# Patient Record
Sex: Male | Born: 1965 | Race: White | Hispanic: No | Marital: Single | Smoking: Former smoker
Health system: Southern US, Community
[De-identification: ages and names within clinical notes are randomized; demographics above are authoritative.]

## PROBLEM LIST (undated history)

## (undated) DIAGNOSIS — Z87442 Personal history of urinary calculi: Secondary | ICD-10-CM

## (undated) DIAGNOSIS — Z8249 Family history of ischemic heart disease and other diseases of the circulatory system: Secondary | ICD-10-CM

## (undated) DIAGNOSIS — I10 Essential (primary) hypertension: Secondary | ICD-10-CM

## (undated) DIAGNOSIS — F419 Anxiety disorder, unspecified: Secondary | ICD-10-CM

## (undated) DIAGNOSIS — R519 Headache, unspecified: Secondary | ICD-10-CM

## (undated) DIAGNOSIS — H9319 Tinnitus, unspecified ear: Secondary | ICD-10-CM

## (undated) DIAGNOSIS — R51 Headache: Secondary | ICD-10-CM

## (undated) HISTORY — PX: ADENOIDECTOMY: SUR15

## (undated) HISTORY — PX: NASAL SINUS SURGERY: SHX719

## (undated) HISTORY — DX: Tinnitus, unspecified ear: H93.19

## (undated) HISTORY — DX: Gilbert syndrome: E80.4

## (undated) HISTORY — DX: Family history of ischemic heart disease and other diseases of the circulatory system: Z82.49

---
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
## 1997-08-17 ENCOUNTER — Ambulatory Visit: Admission: RE | Admit: 1997-08-17 | Discharge: 1997-08-17 | Payer: Self-pay | Admitting: Family Medicine

## 1999-11-28 ENCOUNTER — Encounter: Admission: RE | Admit: 1999-11-28 | Discharge: 1999-11-28 | Payer: Self-pay | Admitting: Orthopedic Surgery

## 1999-11-28 ENCOUNTER — Encounter: Payer: Self-pay | Admitting: Orthopedic Surgery

## 2003-10-27 ENCOUNTER — Emergency Department (HOSPITAL_COMMUNITY): Admission: EM | Admit: 2003-10-27 | Discharge: 2003-10-28 | Payer: Self-pay | Admitting: *Deleted

## 2004-09-28 HISTORY — PX: CARDIOVASCULAR STRESS TEST: SHX262

## 2005-09-30 HISTORY — PX: TRANSTHORACIC ECHOCARDIOGRAM: SHX275

## 2010-06-11 ENCOUNTER — Emergency Department (HOSPITAL_COMMUNITY)
Admission: EM | Admit: 2010-06-11 | Discharge: 2010-06-11 | Disposition: A | Discharge status: Home / Self Care | Payer: BC Managed Care – PPO | Attending: Emergency Medicine | Admitting: Emergency Medicine

## 2010-06-11 DIAGNOSIS — H1130 Conjunctival hemorrhage, unspecified eye: Secondary | ICD-10-CM | POA: Insufficient documentation

## 2010-06-11 DIAGNOSIS — I10 Essential (primary) hypertension: Secondary | ICD-10-CM | POA: Insufficient documentation

## 2011-01-09 ENCOUNTER — Other Ambulatory Visit: Payer: Self-pay

## 2011-01-09 ENCOUNTER — Observation Stay (HOSPITAL_COMMUNITY)
Admission: EM | Admit: 2011-01-09 | Discharge: 2011-01-10 | Disposition: A | Discharge status: Home / Self Care | Payer: BC Managed Care – PPO | Attending: Emergency Medicine | Admitting: Emergency Medicine

## 2011-01-09 ENCOUNTER — Encounter: Payer: Self-pay | Admitting: *Deleted

## 2011-01-09 DIAGNOSIS — F411 Generalized anxiety disorder: Secondary | ICD-10-CM | POA: Insufficient documentation

## 2011-01-09 DIAGNOSIS — I10 Essential (primary) hypertension: Secondary | ICD-10-CM | POA: Insufficient documentation

## 2011-01-09 DIAGNOSIS — R110 Nausea: Secondary | ICD-10-CM | POA: Insufficient documentation

## 2011-01-09 DIAGNOSIS — R079 Chest pain, unspecified: Principal | ICD-10-CM | POA: Insufficient documentation

## 2011-01-09 HISTORY — DX: Essential (primary) hypertension: I10

## 2011-01-09 HISTORY — DX: Anxiety disorder, unspecified: F41.9

## 2011-01-09 LAB — DIFFERENTIAL
Basophils Relative: 1 % (ref 0–1)
Eosinophils Absolute: 0.1 10*3/uL (ref 0.0–0.7)
Eosinophils Relative: 1 % (ref 0–5)
Lymphs Abs: 2.8 10*3/uL (ref 0.7–4.0)
Monocytes Relative: 8 % (ref 3–12)
Neutrophils Relative %: 58 % (ref 43–77)

## 2011-01-09 LAB — CBC
MCH: 29.7 pg (ref 26.0–34.0)
MCHC: 35.1 g/dL (ref 30.0–36.0)
MCV: 84.8 fL (ref 78.0–100.0)
Platelets: 275 10*3/uL (ref 150–400)
RBC: 5.25 MIL/uL (ref 4.22–5.81)

## 2011-01-09 LAB — COMPREHENSIVE METABOLIC PANEL
Albumin: 4.1 g/dL (ref 3.5–5.2)
Alkaline Phosphatase: 91 U/L (ref 39–117)
BUN: 18 mg/dL (ref 6–23)
Calcium: 9.1 mg/dL (ref 8.4–10.5)
GFR calc Af Amer: 89 mL/min — ABNORMAL LOW (ref >90)
Glucose, Bld: 102 mg/dL — ABNORMAL HIGH (ref 70–99)
Potassium: 3.2 mEq/L — ABNORMAL LOW (ref 3.5–5.1)
Sodium: 141 mEq/L (ref 135–145)
Total Protein: 7.2 g/dL (ref 6.0–8.3)

## 2011-01-09 LAB — TROPONIN I: Troponin I: <0.3 ng/mL (ref <0.30)

## 2011-01-09 LAB — CK TOTAL AND CKMB (NOT AT ARMC)
Relative Index: 1.6 (ref 0.0–2.5)
Total CK: 149 U/L (ref 7–232)

## 2011-01-09 NOTE — ED Notes (Signed)
Since Friday he has had some lt jaw pain and lt arm pain with some  Lt upper chest pain.  He has a history of some anxiety issues and he has had  Med changes

## 2011-01-10 ENCOUNTER — Encounter (HOSPITAL_COMMUNITY): Payer: Self-pay | Admitting: Emergency Medicine

## 2011-01-10 ENCOUNTER — Emergency Department (HOSPITAL_COMMUNITY): Payer: BC Managed Care – PPO

## 2011-01-10 LAB — CARDIAC PANEL(CRET KIN+CKTOT+MB+TROPI): Total CK: 138 U/L (ref 7–232)

## 2011-01-10 MED ORDER — METOPROLOL TARTRATE 25 MG PO TABS
50.0000 mg | ORAL_TABLET | Freq: Once | ORAL | Status: DC
  Filled 2011-01-10: qty 4

## 2011-01-10 MED ORDER — LORAZEPAM 2 MG/ML IJ SOLN
1.0000 mg | Freq: Once | INTRAMUSCULAR | Status: AC
  Administered 2011-01-10: 1 mg via INTRAVENOUS
  Filled 2011-01-10: qty 1

## 2011-01-10 MED ORDER — ALPRAZOLAM 0.5 MG PO TABS
0.5000 mg | ORAL_TABLET | Freq: Once | ORAL | Status: AC
  Administered 2011-01-10: 0.5 mg via ORAL

## 2011-01-10 MED ORDER — METOPROLOL TARTRATE 1 MG/ML IV SOLN
5.0000 mg | Freq: Once | INTRAVENOUS | Status: AC
  Administered 2011-01-10: 5 mg via INTRAVENOUS

## 2011-01-10 MED ORDER — LORAZEPAM 1 MG PO TABS: 1.0000 mg | ORAL_TABLET | Freq: Once | ORAL | Status: DC

## 2011-01-10 MED ORDER — IOHEXOL 350 MG/ML SOLN
80.0000 mL | Freq: Once | INTRAVENOUS | Status: AC | PRN
  Administered 2011-01-10: 80 mL via INTRAVENOUS

## 2011-01-10 MED ORDER — SODIUM CHLORIDE 0.9 % IV SOLN
Freq: Once | INTRAVENOUS | Status: AC
  Administered 2011-01-10: 10:00:00 via INTRAVENOUS

## 2011-01-10 MED ORDER — METOPROLOL TARTRATE 25 MG PO TABS
100.0000 mg | ORAL_TABLET | Freq: Once | ORAL | Status: AC
  Administered 2011-01-10: 100 mg via ORAL

## 2011-01-10 MED ORDER — NITROGLYCERIN 0.4 MG SL SUBL
SUBLINGUAL_TABLET | SUBLINGUAL | Status: AC
  Administered 2011-01-10: 0.4 mg
  Filled 2011-01-10: qty 25

## 2011-01-10 MED ORDER — METOPROLOL TARTRATE 1 MG/ML IV SOLN
INTRAVENOUS | Status: AC
  Administered 2011-01-10: 5 mg via INTRAVENOUS
  Filled 2011-01-10: qty 10

## 2011-01-10 NOTE — ED Notes (Signed)
Began having chest pain 1730 01/09/11. Pain in left jaw and arm on Sunday which he describes as pressure. Had stress test 8 years ago. Placed on beta blockers app 3 weeks ago.  States he hasn't been feeling well since starting beta blockers. States he still has some mild chest pressure.

## 2011-01-10 NOTE — ED Notes (Signed)
2nd administration of Metoprolol 5mg  given IV as per Dr Carlota Raspberry

## 2011-01-10 NOTE — ED Notes (Signed)
Returned from CT-A. Pt tolerated well. 

## 2011-01-10 NOTE — ED Provider Notes (Signed)
History     CSN: [id number] Arrival date & time: 01/09/2011  8:26 PM   First MD Initiated Contact with Patient 01/10/11 0029      Chief Complaint  Patient presents with  . Chest Pain    (Consider location/radiation/quality/duration/timing/severity/associated sxs/prior treatment) HPI  Past Medical History  Diagnosis Date  . Hypertension   . Anxiety     History reviewed. No pertinent past surgical history.  Family History  Problem Relation Age of Onset  . Hypertension Father   . Coronary artery disease Father     History  Substance Use Topics  . 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 Smokeless tobacco: Not on file  . Alcohol Use: No      Review of Systems  Allergies  Mango butter; Prilosec; and Shrimp  Home Medications   Current Outpatient Rx  Name Route Sig Dispense Refill  . ALPRAZOLAM ER 0.5 MG PO TB24 Oral Take 0.5 mg by mouth 2 (two) times daily.      . ASPIRIN 81 MG PO TABS Oral Take 81 mg by mouth daily.      Marland Kitchen VITAMIN-B COMPLEX PO Oral Take 1 tablet by mouth daily.      Marland Kitchen CALCIUM PO Oral Take 1 tablet by mouth daily.      Marland Kitchen VITAMIN D 1000 UNITS PO TABS Oral Take 1,000 Units by mouth daily.      . IBUPROFEN 200 MG PO TABS Oral Take 200 mg by mouth every 6 (six) hours as needed. For pain     . METOPROLOL SUCCINATE ER 50 MG PO TB24 Oral Take 50 mg by mouth daily.        BP 138/88  Pulse 61  Temp(Src) 98.4 F (36.9 C) (Oral)  Resp 13  SpO2 94%  Physical Exam  ED Course  Procedures (including critical care time)  Labs Reviewed  COMPREHENSIVE METABOLIC PANEL - Abnormal; Notable for the following:    Potassium 3.2 (*)    Glucose, Bld 102 (*)    Total Bilirubin 1.9 (*)    GFR calc non Af Amer 77 (*)    GFR calc Af Amer 89 (*)    All other components within normal limits  CBC  DIFFERENTIAL  CK TOTAL AND CKMB  TROPONIN I  CARDIAC PANEL(CRET KIN+CKTOT+MB+TROPI)   Dg Chest 2 View  01/10/2011  *RADIOLOGY REPORT*  Clinical Data: Chest pain   CHEST - 2 VIEW  Comparison: None.  Findings: Vague nodular opacity over the right upper lung measuring about 9 mm diameter.  This is indeterminate. Non emergent CT recommended to exclude significant pulmonary nodule.  Normal heart size and pulmonary vascularity.  No focal airspace consolidation in the lungs.  No blunting of costophrenic angles.  No pneumothorax.  IMPRESSION: 9 mm nodular opacity projected over the right upper lung.  CT recommended to exclude significant nodule.  No evidence of active consolidation in the lungs.  Original Report Authenticated By: Marlon Pel, M.D.   Ct Heart Morp W/cta Cor W/score W/ca W/cm &/or Wo/cm  01/10/2011  *RADIOLOGY REPORT*  INDICATION:  Recurrent chest pain.  CT ANGIOGRAPHY OF THE HEART, CORONARY ARTERY, STRUCTURE, AND MORPHOLOGY  CONTRAST: 80mL OMNIPAQUE IOHEXOL 350 MG/ML IV SOLN  COMPARISON:  01/10/2011 chest radiograph.  TECHNIQUE:  CT angiography of the coronary vessels was performed on a 256 channel system using prospective ECG gating.  A scout and noncontrast exam (for calcium scoring) were performed.  Circulation time was measured using a test bolus.  Coronary CTA  was performed with sub mm slice collimation during portions of the cardiac cycle after prior injection of iodinated contrast.  Imaging post processing was performed on an independent workstation creating multiplanar and 3-D images, and quantitative analysis of the heart and coronary arteries.  Note that this exam targets the heart and the chest was not imaged in its entirety.  PREMEDICATION: Lopressor 100 mg, P.O. Lopressor 10 mg, IV Nitroglycerin 0.4 mcg, sublingual.  FINDINGS: Technical quality:  Good  Heart rate:  50  CORONARY ARTERIES: Left main coronary artery:  Widely patent. Left anterior descending:  Widely patent. Left circumflex:  Widely patent. Right coronary artery:  Widely patent. Posterior descending artery:  Widely patent. Dominance:  Right  CORONARY CALCIUM: Total Agatston Score:   Zero MESA database percentile:  Not applicable  CARDIAC MEASUREMENTS: Interventricular septum (6 - 12 mm):  9 mm LV posterior wall (6 - 12 mm):  9 mm LV diameter in diastole (35 - 52 mm):  36 mm  AORTA AND PULMONARY MEASUREMENTS: Aortic root (21 - 40 mm):             27 mm  at the annulus             35 mm  at the sinuses of Valsalva             25 mm  at the sinotubular junction Ascending aorta ( <  40 mm):  31 mm  Descending aorta ( <  40 mm):  22 mm Main pulmonary artery:  ( <  30 mm):  22 mm  EXTRACARDIAC FINDINGS: Pulmonary nodule identified on prior chest radiograph is not imaged due to cardiac CT protocol.  Probable fatty liver is partially visualized in the upper abdomen.  The lungs demonstrate dependent atelectasis.  IMPRESSION: 1.  No coronary artery disease.  The patient's total coronary artery calcium score is zero. 2.  No hard or soft plaque identified. 3.  Right coronary artery dominance. 4.  Probable fatty liver. 5.  Pulmonary nodules suggested on prior radiographs is not visualized due to cardiac CT protocol.  Report was called to Estevan Oaks at 1110 hours on 01/10/2011.  Original Report Authenticated By: Andreas Newport, M.D.     1. Chest pain    7:30 AM Handoff from Dr. Norlene Campbell. Pt awaiting treadmill stress.   7:47 AM Patient seen and examined. Patient has not had recurrence of pain overnight. He is resting comfortably.  7:47 AM Exam:  Gen NAD; Heart RRR, nml S1,S2, no m/r/g; Lungs CTAB; Abd soft, NT, no rebound or guarding; Ext 2+ pedal pulses bilaterally, no edema.  11:27 AM I spoke with the radiologist. Patient does not have any coronary artery disease seen on CT. Patient informed. 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  11:27 AM Patient informed of results. Will discharge to home.  Patient told to follow-up with PCP in next week.  Patient told to return to ED with persistent CP associated with exertion, sweating, racing heart,  palpitations, shortness of breath, lightheadedness, radiation of pain into jaw, neck, or arms.  Patient verbalizes understanding and agrees with plan.       MDM  Chest pain. Negative cardiac CT. Patient has known nodule in chest. Patient appears well and is stable for discharge to home.         Carolee Rota, [place] 01/10/11 1128

## 2011-01-10 NOTE — ED Provider Notes (Signed)
Medical screening examination/treatment/procedure(s) were performed by non-physician practitioner and as supervising physician I was immediately available for consultation/collaboration.  Pt seen by me initially and care transferred to CDU PA awaiting definitive testing.  Olivia Mackie, MD 01/10/11 [phone]

## 2011-01-10 NOTE — ED Notes (Signed)
Transported to CT for CTA.  Ativan 1mg  administered IV prior to transport d/t pt's anxiety.

## 2011-01-10 NOTE — ED Provider Notes (Signed)
History     CSN: [id number] Arrival date & time: 01/09/2011  8:26 PM   First MD Initiated Contact with Patient 01/10/11 0029      Chief Complaint  Patient presents with  . Chest Pain    (Consider location/radiation/quality/duration/timing/severity/associated sxs/prior treatment) HPI 45 year old male presents to the emergency department with complaint of sharp chest pain starting today around 5:30 as he came home from work. Pain was left-sided. Pain has been intermittent since onset, pain is not associated with shortness of breath. He has had periods of nausea and diaphoresis. Patient reports on Sunday night he had pain in his left arm and left jaw and has been feeling "out of sorts" the last 24 hours. Patient denies any chest pain associated with left jaw and left arm pain however. Patient with history of hypertension and anxiety. The patient reports early coronary disease in one of his uncles in his 81s, however otherwise most male members of his family have heart attacks in their 1s. Patient reports remote history of workup by cardiology, [facility] 8 years ago which was negative. Patient  was seen at the [facility] walk-in clinic and sent on to the ER due to symptoms. Patient currently asymptomatic and feeling fine  Past Medical History  Diagnosis Date  . Hypertension   . Anxiety     History reviewed. No pertinent past surgical history.  Family History  Problem Relation Age of Onset  . Hypertension Father   . Coronary artery disease Father     History  Substance Use Topics  . 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 Smokeless tobacco: Not on file  . Alcohol Use: No      Review of Systems  Constitutional: Positive for fatigue.  Respiratory: Positive for chest tightness. Negative for shortness of breath.   Cardiovascular: Positive for chest pain.  Gastrointestinal: Positive for nausea.  All other systems reviewed and are negative.    Allergies  Mango  butter; Prilosec; and Shrimp  Home Medications   Current Outpatient Rx  Name Route Sig Dispense Refill  . ALPRAZOLAM ER 0.5 MG PO TB24 Oral Take 0.5 mg by mouth 2 (two) times daily.      . ASPIRIN 81 MG PO TABS Oral Take 81 mg by mouth daily.      Marland Kitchen VITAMIN-B COMPLEX PO Oral Take 1 tablet by mouth daily.      Marland Kitchen CALCIUM PO Oral Take 1 tablet by mouth daily.      Marland Kitchen VITAMIN D 1000 UNITS PO TABS Oral Take 1,000 Units by mouth daily.      . IBUPROFEN 200 MG PO TABS Oral Take 200 mg by mouth every 6 (six) hours as needed. For pain     . METOPROLOL SUCCINATE ER 50 MG PO TB24 Oral Take 50 mg by mouth daily.        BP 138/88  Pulse 61  Temp(Src) 98.4 F (36.9 C) (Oral)  Resp 13  SpO2 94%  Physical Exam  Nursing note and vitals reviewed. Constitutional: He is oriented to person, place, and time. He appears well-developed and well-nourished.  HENT:  Head: Normocephalic and atraumatic.  Right Ear: External ear normal.  Left Ear: External ear normal.  Nose: Nose normal.  Mouth/Throat: Oropharynx is clear and moist.  Eyes: Conjunctivae and EOM are normal. Pupils are equal, round, and reactive to light.  Neck: Normal range of motion. Neck supple. No JVD present. No tracheal deviation present. No thyromegaly present.  Cardiovascular: Normal rate, regular  rhythm, normal heart sounds and intact distal pulses.  Exam reveals no gallop and no friction rub.   No murmur heard. Pulmonary/Chest: Effort normal and breath sounds normal. No stridor. No respiratory distress. He has no wheezes. He has no rales. He exhibits no tenderness.  Abdominal: Soft. Bowel sounds are normal. He exhibits no distension and no mass. There is no tenderness. There is no rebound and no guarding.  Musculoskeletal: Normal range of motion. He exhibits no edema and no tenderness.  Lymphadenopathy:    He has no cervical adenopathy.  Neurological: He is oriented to person, place, and time. He exhibits normal muscle tone.  Coordination normal.  Skin: Skin is dry. No rash noted. No erythema. No pallor.  Psychiatric: He has a normal mood and affect. His behavior is normal. Judgment and thought content normal.    ED Course  Procedures (including critical care time)  Labs Reviewed  COMPREHENSIVE METABOLIC PANEL - Abnormal; Notable for the following:    Potassium 3.2 (*)    Glucose, Bld 102 (*)    Total Bilirubin 1.9 (*)    GFR calc non Af Amer 77 (*)    GFR calc Af Amer 89 (*)    All other components within normal limits  CBC  DIFFERENTIAL  CK TOTAL AND CKMB  TROPONIN I  CARDIAC PANEL(CRET KIN+CKTOT+MB+TROPI)   Dg Chest 2 View  01/10/2011  *RADIOLOGY REPORT*  Clinical Data: Chest pain  CHEST - 2 VIEW  Comparison: None.  Findings: Vague nodular opacity over the right upper lung measuring about 9 mm diameter.  This is indeterminate. Non emergent CT recommended to exclude significant pulmonary nodule.  Normal heart size and pulmonary vascularity.  No focal airspace consolidation in the lungs.  No blunting of costophrenic angles.  No pneumothorax.  IMPRESSION: 9 mm nodular opacity projected over the right upper lung.  CT recommended to exclude significant nodule.  No evidence of active consolidation in the lungs.  Original Report Authenticated By: Marlon Pel, M.D.    Date: 01/09/2011  Rate: 79  Rhythm: normal sinus rhythm  QRS Axis: normal  Intervals: normal  ST/T Wave abnormalities: twave flattening  Conduction Disutrbances:none  Narrative Interpretation:   Old EKG Reviewed: none available   1. Chest pain       MDM  45 year old male with history of hypertension prior workup 8 years ago per him negative for coronary disease, but strong family history. Patient seems to have atypical chest pain in his low risk overall. We'll place in CDU observation for chest pain. Workup and plan explained to patient who agrees to stay        Olivia Mackie, MD 01/10/11 [phone]

## 2011-01-13 ENCOUNTER — Ambulatory Visit (INDEPENDENT_AMBULATORY_CARE_PROVIDER_SITE_OTHER): Payer: BC Managed Care – PPO

## 2011-01-13 DIAGNOSIS — F411 Generalized anxiety disorder: Secondary | ICD-10-CM

## 2011-01-13 DIAGNOSIS — H9319 Tinnitus, unspecified ear: Secondary | ICD-10-CM

## 2011-03-13 ENCOUNTER — Encounter: Payer: Self-pay | Admitting: Physician Assistant

## 2011-03-13 ENCOUNTER — Telehealth: Payer: Self-pay | Admitting: Physician Assistant

## 2011-03-13 MED ORDER — ALPRAZOLAM ER 0.5 MG PO TB24: 0.5000 mg | ORAL_TABLET | Freq: Two times a day (BID) | ORAL | Status: DC

## 2011-03-13 NOTE — Telephone Encounter (Signed)
Encounter opened in error

## 2011-03-13 NOTE — Telephone Encounter (Addendum)
[facility] And notified pt

## 2011-03-13 NOTE — Telephone Encounter (Signed)
Please call in to pharmacy. csj

## 2011-04-11 ENCOUNTER — Other Ambulatory Visit: Payer: Self-pay | Admitting: Family Medicine

## 2011-04-11 MED ORDER — ALPRAZOLAM ER 0.5 MG PO TB24: 0.5000 mg | ORAL_TABLET | Freq: Two times a day (BID) | ORAL | Status: DC

## 2011-05-12 ENCOUNTER — Telehealth: Payer: Self-pay

## 2011-05-12 NOTE — Telephone Encounter (Signed)
Pt states he called his pharmacy on Monday to request a rx refill on xanax he contacted pharmacy today to see if rx was ready and pharmacy told him that they have sent in request to [place] but no refill, pt would like to know if he needs to come in for ov please let him know [place] is [place] from [place], if this needs to be done in order to get another rx for his medication

## 2011-05-13 ENCOUNTER — Telehealth: Payer: Self-pay

## 2011-05-13 MED ORDER — ALPRAZOLAM ER 0.5 MG PO TB24: 0.5000 mg | ORAL_TABLET | Freq: Two times a day (BID) | ORAL | Status: DC

## 2011-05-13 NOTE — Telephone Encounter (Signed)
ADVISED PT THAT RX WAS FAXED IN 

## 2011-05-13 NOTE — Telephone Encounter (Signed)
PT HAS BEEN TRYING SINCE Monday 4/8 TO GET RX THROUGH [facility] [address]. I BELIEVE ITS A [facility] PROBLEM BUT PT NEEDS TO BE TAKEN CARE OF.  THANK YOU!  BEST NUMBER IS [id number].

## 2011-06-12 ENCOUNTER — Telehealth: Payer: Self-pay

## 2011-06-12 NOTE — Telephone Encounter (Signed)
Pt is needing rx refill on xanax and is wanting to know if it is better to go with another pharmacy since last time was such a big problem with [facility] [address].

## 2011-06-13 MED ORDER — ALPRAZOLAM ER 0.5 MG PO TB24: 0.5000 mg | ORAL_TABLET | Freq: Two times a day (BID) | ORAL | Status: DC

## 2011-06-13 NOTE — Telephone Encounter (Signed)
PT NOTIFIED THAT RX WAS SENT IN 

## 2011-06-13 NOTE — Telephone Encounter (Signed)
We can send the rx to whichever pharmacy the patient wants. Rx printed.

## 2011-07-11 ENCOUNTER — Other Ambulatory Visit: Payer: Self-pay | Admitting: Physician Assistant

## 2011-07-11 MED ORDER — ALPRAZOLAM ER 0.5 MG PO TB24: 0.5000 mg | ORAL_TABLET | Freq: Two times a day (BID) | ORAL | Status: DC

## 2011-07-12 ENCOUNTER — Telehealth: Payer: Self-pay

## 2011-07-12 NOTE — Telephone Encounter (Signed)
[facility] notifying patient that rx was faxed into pharmacy yesterday.  CB if ?'s.

## 2011-07-12 NOTE — Telephone Encounter (Signed)
PATIENT REQUEST A REFILL ON XANAX 1/2MG  ER PLEASE. HE STATES HE WILL BE TOTALLY OUT ON Thursday. BEST PHONE [phone] (CELL)  [facility]   MBC

## 2011-08-08 ENCOUNTER — Other Ambulatory Visit: Payer: Self-pay | Admitting: Family Medicine

## 2011-08-08 MED ORDER — ALPRAZOLAM ER 0.5 MG PO TB24: 0.5000 mg | ORAL_TABLET | Freq: Two times a day (BID) | ORAL | Status: DC

## 2011-08-09 ENCOUNTER — Telehealth: Payer: Self-pay | Admitting: Physician Assistant

## 2011-08-09 NOTE — Telephone Encounter (Signed)
Spoke with Dr. Ledon Snare - Pt doing well with therapy.  Still has tinnitus but controlled on Xanax Xr 0.5mg  bid.  He has been unable to tolerate SSRIs in the past.  He is having some trouble sleeping and would like to try a sleep aid.  Recommended trazodone 50-100mg  qhs.  Pt will f/u here for that Rx.

## 2011-08-11 ENCOUNTER — Ambulatory Visit: Payer: BC Managed Care – PPO | Admitting: Physician Assistant

## 2011-08-11 VITALS — BP 121/92 | HR 92 | Temp 98.5°F | Resp 18 | Ht 59.5 in | Wt 228.0 lb

## 2011-08-11 DIAGNOSIS — F419 Anxiety disorder, unspecified: Secondary | ICD-10-CM

## 2011-08-11 DIAGNOSIS — G4700 Insomnia, unspecified: Secondary | ICD-10-CM

## 2011-08-11 MED ORDER — TRAZODONE HCL 50 MG PO TABS: 25.0000 mg | ORAL_TABLET | Freq: Every evening | ORAL | Status: DC | PRN

## 2011-08-11 NOTE — Progress Notes (Signed)
  Subjective:    Patient ID: Andrew Li, male    DOB: [birth year], 46 y.o.   MRN: [id number]  HPI Pt presents to clinic for recheck of tinnitus, anxiety and insomnia.  He has been successfully treating his tinnitus with neuro-feedback and other such treatments.  He feels as though he might be ready to try to decrease his xanax but afraid to go off completely.  He has recently started having trouble with insomnia and not being able to get to sleep, once he gets to sleep he can stay asleep without problems.   Review of Systems     Objective:   Physical Exam  Vitals reviewed. Constitutional: He is oriented to person, place, and time. He appears well-developed and well-nourished.  HENT:  Head: Normocephalic and atraumatic.  Right Ear: External ear normal.  Left Ear: External ear normal.  Cardiovascular: Normal rate, regular rhythm and normal heart sounds.   Pulmonary/Chest: Effort normal and breath sounds normal.  Neurological: He is alert and oriented to person, place, and time.  Skin: Skin is warm and dry.  Psychiatric: He has a normal mood and affect. His behavior is normal. Judgment and thought content normal.          Assessment & Plan:   1. Insomnia  traZODone (DESYREL) 50 MG tablet  2. Anxiety  traZODone (DESYREL) 50 MG tablet   D/w pt how to titrate Xanax XR - pt can try qd dosing to see if anxiety is still controlled.  Pt to try trazodone for insomnia and can try 25-100mg  qhs as needed.  Pt to recheck in 6 months.

## 2011-10-06 ENCOUNTER — Ambulatory Visit: Payer: BC Managed Care – PPO | Admitting: Physician Assistant

## 2011-10-06 VITALS — BP 130/90 | HR 67 | Temp 98.6°F | Resp 18 | Ht 70.0 in | Wt 219.0 lb

## 2011-10-06 DIAGNOSIS — F411 Generalized anxiety disorder: Secondary | ICD-10-CM

## 2011-10-06 DIAGNOSIS — H9319 Tinnitus, unspecified ear: Secondary | ICD-10-CM

## 2011-10-06 DIAGNOSIS — F419 Anxiety disorder, unspecified: Secondary | ICD-10-CM

## 2011-10-06 MED ORDER — ALPRAZOLAM ER 0.5 MG PO TB24: 0.5000 mg | ORAL_TABLET | Freq: Once | ORAL | Status: DC | PRN

## 2011-10-06 NOTE — Progress Notes (Signed)
  Subjective:    Patient ID: Andrew Li, male    DOB: 1965/10/23, 46 y.o.   MRN: [id number]  HPI Pt presents to clinic for recheck.  Andrew Li is doing much better.  Tinnitus is almost gone.  Neurofeedback is almost done. Andrew Li has decreased his Xanax XR to only once a day and Andrew Li has had no problems. Andrew Li has only used a couple of Trazodone because Andrew Li is sleeping for about 12h after it.  But his sleep is ok.  Review of Systems  HENT: Negative for hearing loss.   Psychiatric/Behavioral: The patient is nervous/anxious.        Objective:   Physical Exam  Vitals reviewed. Constitutional: Andrew Li appears well-developed and well-nourished.  HENT:  Head: Normocephalic and atraumatic.  Right Ear: External ear normal.  Left Ear: External ear normal.  Pulmonary/Chest: Effort normal.  Skin: Skin is warm and dry.  Psychiatric: Andrew Li has a normal mood and affect. His behavior is normal. Judgment normal.       Assessment & Plan:   1. Tinnitus  ALPRAZolam (XANAX XR) 0.5 MG 24 hr tablet  2. Anxiety  ALPRAZolam (XANAX XR) 0.5 MG 24 hr tablet   Can refill once if needed but pt expects to titrate down over the next 3wks to nothing regularly and only use PRN.  Pt plans to decrease to qod for 2 wks then q3d for 2 wks and as long as his symptoms are still gone Andrew Li can stop it.  Pt is still seeing Dr. Ledon Snare.

## 2013-07-03 IMAGING — CT CT HEART MORP W/ CTA COR W/ SCORE W/ CA W/CM &/OR W/O CM
1 series · 1 of 1 positions shown · IV contrast (CONTRAST)
Comparison: 01/10/2011 chest radiograph.

INDICATION: Recurrent chest pain.

CT ANGIOGRAPHY OF THE HEART, CORONARY ARTERY, STRUCTURE, AND
MORPHOLOGY
CONTRAST: 80mL OMNIPAQUE IOHEXOL 350 MG/ML IV SOLN
TECHNIQUE: CT angiography of the coronary vessels was performed on
a 256 channel system using prospective ECG gating.  A scout and
noncontrast exam (for calcium scoring) were performed.  Circulation
time was measured using a test bolus.  Coronary CTA was performed
with sub mm slice collimation during portions of the cardiac cycle
after prior injection of iodinated contrast.  Imaging post
processing was performed on an independent workstation creating
multiplanar and 3-D images, and quantitative analysis of the heart
and coronary arteries.  Note that this exam targets the heart and
the chest was not imaged in its entirety.
PREMEDICATION:
Lopressor 100 mg, P.O.
Lopressor 10 mg, IV
Nitroglycerin 0.4 mcg, sublingual.

[Series 1303: rca, straight mpr, 83 (1)% · 1 of 1 slices shown]
[im 1/1]
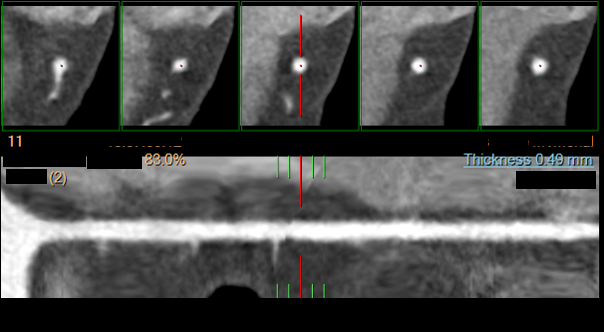

[1 of 1 positions shown; findings below may reference images not displayed]

FINDINGS: Technical quality:  Good

Heart rate:  50

CORONARY ARTERIES:
Left main coronary artery:  Widely patent.
Left anterior descending:  Widely patent.
Left circumflex:  Widely patent.
Right coronary artery:  Widely patent.
Posterior descending artery:  Widely patent.
Dominance:  Right

CORONARY CALCIUM:
Total Agatston Score:  Zero
[HOSPITAL] percentile:  Not applicable

CARDIAC MEASUREMENTS:
Interventricular septum (6 - 12 mm):  9 mm
LV posterior wall (6 - 12 mm):  9 mm
LV diameter in diastole (35 - 52 mm):  36 mm

AORTA AND PULMONARY MEASUREMENTS:
Aortic root (21 - 40 mm):
            27 mm  at the annulus
            35 mm  at the sinuses of Valsalva
            25 mm  at the sinotubular junction
Ascending aorta ( <  40 mm):  31 mm

Descending aorta ( <  40 mm):  22 mm
Main pulmonary artery:  ( <  30 mm):  22 mm

EXTRACARDIAC FINDINGS:
Pulmonary nodule identified on prior chest radiograph is not imaged
due to cardiac CT protocol.  Probable fatty liver is partially
visualized in the upper abdomen.  The lungs demonstrate dependent
atelectasis.
IMPRESSION: 1.  No coronary artery disease.  The patient's total coronary
artery calcium score is zero.
2.  No hard or soft plaque identified.
3.  Right coronary artery dominance.
4.  Probable fatty liver.
5.  Pulmonary nodules suggested on prior radiographs is not
visualized due to cardiac CT protocol.

Report was called to Blanqui Edwards at 6669 hours on 01/10/2011.

## 2013-09-12 ENCOUNTER — Encounter: Payer: Self-pay | Admitting: *Deleted

## 2013-09-15 ENCOUNTER — Encounter: Payer: Self-pay | Admitting: Cardiovascular Disease

## 2013-09-15 ENCOUNTER — Ambulatory Visit (INDEPENDENT_AMBULATORY_CARE_PROVIDER_SITE_OTHER): Payer: BC Managed Care – PPO | Admitting: Cardiovascular Disease

## 2013-09-15 VITALS — BP 130/80 | HR 72 | Resp 16 | Ht 70.0 in | Wt 206.0 lb

## 2013-09-15 DIAGNOSIS — I10 Essential (primary) hypertension: Secondary | ICD-10-CM

## 2013-09-15 DIAGNOSIS — Z8249 Family history of ischemic heart disease and other diseases of the circulatory system: Secondary | ICD-10-CM

## 2013-09-15 DIAGNOSIS — R55 Syncope and collapse: Secondary | ICD-10-CM

## 2013-09-15 NOTE — Patient Instructions (Signed)
Dr. Croitoru recommends that you schedule a follow-up appointment in: One year.   

## 2013-09-17 ENCOUNTER — Encounter: Payer: Self-pay | Admitting: Cardiovascular Disease

## 2013-09-17 DIAGNOSIS — I10 Essential (primary) hypertension: Secondary | ICD-10-CM | POA: Insufficient documentation

## 2013-09-17 DIAGNOSIS — R55 Syncope and collapse: Secondary | ICD-10-CM | POA: Insufficient documentation

## 2013-09-17 DIAGNOSIS — Z8249 Family history of ischemic heart disease and other diseases of the circulatory system: Secondary | ICD-10-CM | POA: Insufficient documentation

## 2013-09-17 NOTE — Assessment & Plan Note (Signed)
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 He has a fairly lengthy prodrome and rarely experiences full-blown loss of consciousness. The exact trigger for his events is uncertain. It is possible that the recent increase in frequency of events is related to substantial weight loss. The positive effect of the weight loss is that he no longer requires medication for hypertension. We discussed the fact that he should always immediately he the warning signs of a potential syncopal events and it assumed a supine position until the symptoms subside. He is encouraged to aggressively hydrate him to eat a moderately salty diet. Aggressive salt loading is not recommended in view of his history of systemic hypertension. I recommended that he return back to regular physical exercise, always taking care to be very well hydrated and to cool down for lengthy periods of time before stopping his exercise completely. At this point tilt table testing does not appear necessary, nor does pharmacological therapy. If he again should he develop systemic hypertension a beta blocker without vasodilatory properties would be the best choice of treatment.Marland Kitchen

## 2013-09-17 NOTE — Progress Notes (Signed)
Patient ID: ATHEN RIEL, male   DOB: 10/26/1965, 48 y.o.   MRN: [id number]     Reason for office visit Syncope  Mr. Whidbee is a 48 year old man with a history of hypertension and a family history of premature coronary artery disease who has also had problems with anxiety and tinnitus. He presents today with several episodes of presyncope and one full blown syncopal episode that occurred over the last several months.  One day he had finished exercising at the gym and felt unwell. He describes it as having "alarm bells going off in his head". He had profuse diaphoresis and felt flushed. He had clouding of his vision and as he was trying to leave the gym he lost consciousness completely. To see the paramedics. He did not require a trip to the emergency room or hospitalization. Looking back he has lost consciousness several times in the past going 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 On each occasion he had the same prodrome. He has had several other prodromal events without full blown syncope in the last few months. The exact trigger is unclear. Events occurred exclusively when he is standing and have never occurred while he is supine. He did not have shortness of breath or chest discomfort with any one of these spells and denies palpitations.  Over a period of several months before the syncopal event, he had lost about 30 pounds. After the syncopal event occurred he has been afraid to go back to the gym and has gained back a lot of this weight.   Allergies  Allergen Reactions  . Mango Flavor Anaphylaxis  . Shrimp [Shellfish Allergy] Other (See Comments) and Anaphylaxis    Itching throat  . Citrus Diarrhea  . Omeprazole Nausea And Vomiting  . Pepcid [Famotidine]   . Prilosec [Omeprazole Magnesium] Other (See Comments)    Internal inflamation    Current Outpatient Prescriptions  Medication Sig Dispense Refill  . aspirin 325 MG tablet Take 325 mg by mouth as needed.      . Biotin 10 MG TABS Take 30 mg by  mouth.      . Calcium Carbonate-Vitamin D (CALCIUM PLUS VITAMIN D PO) Take by mouth.      . calcium citrate-vitamin D (CITRACAL+D) 315-200 MG-UNIT per tablet Take 1 tablet by mouth 2 (two) times daily.      . Cholecalciferol (VITAMIN D-3) 5000 UNITS TABS Take by mouth.      . Docosahexaenoic Acid (DHA PO) Take 70 mg by mouth.      . folic acid (FOLVITE) 947 MCG tablet Take 400 mcg by mouth daily.      Marland Kitchen ibuprofen (ADVIL,MOTRIN) 200 MG tablet Take 200 mg by mouth every 6 (six) hours as needed. For pain       . MANGANESE PO Take 3 mg by mouth.      . Niacin (VITAMIN B-3 PO) Take 25 mg by mouth.      . Omega-3 Fatty Acids (EPA PO) Take 80 mg by mouth.      . Potassium (POTASSIMIN PO) Take 50 mg by mouth.      . Pyridoxine HCl (VITAMIN B-6 PO) Take 15 mg by mouth.      . Riboflavin (VITAMIN B2 PO) Take 20 mg by mouth.      . thiamine (VITAMIN B-1) 100 MG tablet Take 100 mg by mouth daily.      . Zinc 50 MG CAPS Take by mouth.       No current facility-administered medications for  this visit.    Past Medical History  Diagnosis Date  . Hypertension   . Anxiety   . Tinnitus   . Gilbert's disease   . Family history of heart disease     Past Surgical History  Procedure Laterality Date  . Adenoidectomy    . Nasal sinus surgery    . Transthoracic echocardiogram  09/2005    borderline conc LVH; LA mildly dilated; trace MR/TR  . Cardiovascular stress test  09/28/2004    no evidence of perfusion abnormality, EF 74%    Family History  Problem Relation Age of Onset  . Hypertension Father 66  . Coronary artery disease Father   . Diabetes Father 7    History   Social History  . Marital Status: Single    Spouse Name: N/A    Number of Children: N/A  . Years of Education: B.S.   Occupational History  . 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 Smoking status: Former Smoker -- 4 years    Quit date: 09/13/1994  . Smokeless tobacco:  Not on file  . Alcohol Use: No  . Drug Use: No  . Sexual Activity: Not on file   Other Topics Concern  . Not on file   Social History Narrative  . No narrative on file    Review of systems: The patient specifically denies any chest pain at rest or with exertion, dyspnea at rest or with exertion, orthopnea, paroxysmal nocturnal dyspnea, palpitations, focal neurological deficits, intermittent claudication, lower extremity edema, unexplained weight gain, cough, hemoptysis or wheezing.  The patient also denies abdominal pain, nausea, vomiting, dysphagia, diarrhea, constipation, polyuria, polydipsia, dysuria, hematuria, frequency, urgency, abnormal bleeding or bruising, fever, chills, unexpected weight changes, mood swings, change in skin or hair texture, change in voice quality, auditory or visual problems, allergic reactions or rashes, new musculoskeletal complaints other than usual "aches and pains".   PHYSICAL EXAM BP 130/80  Pulse 72  Resp 16  Ht 5\' 10"  (1.778 m)  Wt 206 lb (93.441 kg)  BMI 29.56 kg/m2  General: Alert, oriented x3, no distress Head: no evidence of trauma, PERRL, EOMI, no exophtalmos or lid lag, no myxedema, no xanthelasma; normal ears, nose and oropharynx Neck: normal jugular venous pulsations and no hepatojugular reflux; brisk carotid pulses without delay and no carotid bruits Chest: clear to auscultation, no signs of consolidation by percussion or palpation, normal fremitus, symmetrical and full respiratory excursions Cardiovascular: normal position and quality of the apical impulse, regular rhythm, normal first and second heart sounds, no murmurs, rubs or gallops Abdomen: no tenderness or distention, no masses by palpation, no abnormal pulsatility or arterial bruits, normal bowel sounds, no hepatosplenomegaly Extremities: no clubbing, cyanosis or edema; 2+ radial, ulnar and brachial pulses bilaterally; 2+ right femoral, posterior tibial and dorsalis pedis pulses;  2+ left femoral, posterior tibial and dorsalis pedis pulses; no subclavian or femoral bruits Neurological: grossly nonfocal   EKG: Normal sinus rhythm, minor nonspecific T wave abnormalities  TRANSTHORACIC ECHOCARDIOGRAM Date: 09/2005 borderline conc LVH; LA mildly dilated; trace MR/TR  NUCLEAR STRESS TEST Date: 09/28/2004 no evidence of perfusion abnormality, EF 74%  BMET    Component Value Date/Time   NA 141 01/09/2011 2209   K 3.2* 01/09/2011 2209   CL 104 01/09/2011 2209   CO2 26 01/09/2011 2209   GLUCOSE 102* 01/09/2011 2209   BUN 18 01/09/2011 2209   CREATININE 1.14 01/09/2011 2209   CALCIUM 9.1  01/09/2011 2209   GFRNONAA 77* 01/09/2011 2209   GFRAA 89* 01/09/2011 2209     ASSESSMENT AND PLAN 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 He has a fairly lengthy prodrome and rarely experiences full-blown loss of consciousness. The exact trigger for his events is uncertain. It is possible that the recent increase in frequency of events is related to substantial weight loss. The positive effect of the weight loss is that he no longer requires medication for hypertension. We discussed the fact that he should always immediately he the warning signs of a potential syncopal events and it assumed a supine position until the symptoms subside. He is encouraged to aggressively hydrate him to eat a moderately salty diet. Aggressive salt loading is not recommended in view of his history of systemic hypertension. I recommended that he return back to regular physical exercise, always taking care to be very well hydrated and to cool down for lengthy periods of time before stopping his exercise completely. At this point tilt table testing does not appear necessary, nor does pharmacological therapy. If he again should he develop systemic hypertension a beta blocker without vasodilatory properties would be  the best choice of treatment..    Meds ordered this encounter  Medications  . thiamine (VITAMIN B-1) 100 MG tablet    Sig: Take 100 mg by mouth daily.  . Riboflavin (VITAMIN B2 PO)    Sig: Take 20 mg by mouth.  . Niacin (VITAMIN B-3 PO)    Sig: Take 25 mg by mouth.  . Pyridoxine HCl (VITAMIN B-6 PO)    Sig: Take 15 mg by mouth.  . Biotin 10 MG TABS    Sig: Take 30 mg by mouth.  . folic acid (FOLVITE) 852 MCG tablet    Sig: Take 400 mcg by mouth daily.  . calcium citrate-vitamin D (CITRACAL+D) 315-200 MG-UNIT per tablet    Sig: Take 1 tablet by mouth 2 (two) times daily.  . Cholecalciferol (VITAMIN D-3) 5000 UNITS TABS    Sig: Take by mouth.  . DISCONTD: fosamprenavir (LEXIVA) 700 MG tablet    Sig: Take by mouth daily.  . Zinc 50 MG CAPS    Sig: Take by mouth.  . Potassium (POTASSIMIN PO)    Sig: Take 50 mg by mouth.  Marland Kitchen MANGANESE PO    Sig: Take 3 mg by mouth.  . Omega-3 Fatty Acids (EPA PO)    Sig: Take 80 mg by mouth.  . Docosahexaenoic Acid (DHA PO)    Sig: Take 70 mg by mouth.    Holli Humbles, MD, [place] [phone] office [phone] pager

## 2013-09-18 NOTE — Addendum Note (Signed)
Addended by: Janett Labella A on: 09/18/2013 07:44 AM   Modules accepted: Orders

## 2013-12-08 ENCOUNTER — Emergency Department (HOSPITAL_COMMUNITY): Payer: BC Managed Care – PPO

## 2013-12-08 ENCOUNTER — Encounter (HOSPITAL_COMMUNITY): Payer: Self-pay | Admitting: Emergency Medicine

## 2013-12-08 ENCOUNTER — Emergency Department (HOSPITAL_COMMUNITY)
Admission: EM | Admit: 2013-12-08 | Discharge: 2013-12-08 | Disposition: A | Discharge status: Home / Self Care | Payer: BC Managed Care – PPO | Attending: Emergency Medicine | Admitting: Emergency Medicine

## 2013-12-08 DIAGNOSIS — L0889 Other specified local infections of the skin and subcutaneous tissue: Secondary | ICD-10-CM | POA: Insufficient documentation

## 2013-12-08 DIAGNOSIS — Y9289 Other specified places as the place of occurrence of the external cause: Secondary | ICD-10-CM | POA: Insufficient documentation

## 2013-12-08 DIAGNOSIS — S51852A Open bite of left forearm, initial encounter: Secondary | ICD-10-CM | POA: Insufficient documentation

## 2013-12-08 DIAGNOSIS — Z8669 Personal history of other diseases of the nervous system and sense organs: Secondary | ICD-10-CM | POA: Insufficient documentation

## 2013-12-08 DIAGNOSIS — Z8639 Personal history of other endocrine, nutritional and metabolic disease: Secondary | ICD-10-CM | POA: Insufficient documentation

## 2013-12-08 DIAGNOSIS — Z87891 Personal history of nicotine dependence: Secondary | ICD-10-CM | POA: Insufficient documentation

## 2013-12-08 DIAGNOSIS — Y999 Unspecified external cause status: Secondary | ICD-10-CM | POA: Insufficient documentation

## 2013-12-08 DIAGNOSIS — S61452A Open bite of left hand, initial encounter: Secondary | ICD-10-CM

## 2013-12-08 DIAGNOSIS — W540XXA Bitten by dog, initial encounter: Secondary | ICD-10-CM | POA: Insufficient documentation

## 2013-12-08 DIAGNOSIS — Z79899 Other long term (current) drug therapy: Secondary | ICD-10-CM | POA: Insufficient documentation

## 2013-12-08 DIAGNOSIS — Z7982 Long term (current) use of aspirin: Secondary | ICD-10-CM | POA: Diagnosis not present

## 2013-12-08 DIAGNOSIS — I10 Essential (primary) hypertension: Secondary | ICD-10-CM | POA: Insufficient documentation

## 2013-12-08 DIAGNOSIS — Y9389 Activity, other specified: Secondary | ICD-10-CM | POA: Insufficient documentation

## 2013-12-08 DIAGNOSIS — L089 Local infection of the skin and subcutaneous tissue, unspecified: Secondary | ICD-10-CM

## 2013-12-08 DIAGNOSIS — Z8659 Personal history of other mental and behavioral disorders: Secondary | ICD-10-CM | POA: Insufficient documentation

## 2013-12-08 LAB — CBC WITH DIFFERENTIAL/PLATELET
BASOS PCT: 1 % (ref 0–1)
Basophils Absolute: 0.1 10*3/uL (ref 0.0–0.1)
Eosinophils Absolute: 0.1 10*3/uL (ref 0.0–0.7)
Eosinophils Relative: 2 % (ref 0–5)
HEMATOCRIT: 42.2 % (ref 39.0–52.0)
HEMOGLOBIN: 14.2 g/dL (ref 13.0–17.0)
LYMPHS PCT: 33 % (ref 12–46)
Lymphs Abs: 2.5 10*3/uL (ref 0.7–4.0)
MCH: 27 pg (ref 26.0–34.0)
MCHC: 33.6 g/dL (ref 30.0–36.0)
MCV: 80.2 fL (ref 78.0–100.0)
MONO ABS: 0.5 10*3/uL (ref 0.1–1.0)
MONOS PCT: 7 % (ref 3–12)
NEUTROS ABS: 4.3 10*3/uL (ref 1.7–7.7)
NEUTROS PCT: 57 % (ref 43–77)
Platelets: 313 10*3/uL (ref 150–400)
RBC: 5.26 MIL/uL (ref 4.22–5.81)
RDW: 12.3 % (ref 11.5–15.5)
WBC: 7.5 10*3/uL (ref 4.0–10.5)

## 2013-12-08 NOTE — ED Provider Notes (Signed)
  Face-to-face evaluation   History: Bit 3 days ago by dog. Increased swelling and pain today.  Physical exam: Left dorsum hand, puncture, bleeding some; surrounding induration. Mild pain with both active and passive ROM. No crepitation. No redness. Able to flex and extend.  Medical screening examination/treatment/procedure(s) were conducted as a shared visit with non-physician practitioner(s) and myself.  I personally evaluated the patient during the encounter  Richarda Blade, MD 12/09/13 1024

## 2013-12-08 NOTE — Discharge Instructions (Signed)
Read the information below.  You may return to the Emergency Department at any time for worsening condition or any new symptoms that concern you.  If you develop significant increase redness, swelling, pus draining from the wound, you are unable to move your fingers, or you develop fevers greater than 100.4, return to the ER immediately for a recheck.    Please go to Dr Bertis Ruddy office at 9:30am tomorrow.  Do not eat or drink after midnight tonight.   Animal Bite An animal bite can result in a scratch on the skin, deep open cut, puncture of the skin, crush injury, or tearing away of the skin or a body part. Dogs are responsible for most animal bites. Children are bitten more often than adults. An animal bite can range from very mild to more serious. A small bite from your house pet is no cause for alarm. However, some animal bites can become infected or injure a bone or other tissue. You must seek medical care if:  The skin is broken and bleeding does not slow down or stop after 15 minutes.  The puncture is deep and difficult to clean (such as a cat bite).  Pain, warmth, redness, or pus develops around the wound.  The bite is from a stray animal or rodent. There may be a risk of rabies infection.  The bite is from a snake, raccoon, skunk, fox, coyote, or bat. There may be a risk of rabies infection.  The person bitten has a chronic illness such as diabetes, liver disease, or cancer, or the person takes medicine that lowers the immune system.  There is concern about the location and severity of the bite. It is important to clean and protect an animal bite wound right away to prevent infection. Follow these steps:  Clean the wound with plenty of water and soap.  Apply an antibiotic cream.  Apply gentle pressure over the wound with a clean towel or gauze to slow or stop bleeding.  Elevate the affected area above the heart to help stop any bleeding.  Seek medical care. Getting medical  care within 8 hours of the animal bite leads to the best possible outcome. DIAGNOSIS  Your caregiver will most likely:  Take a detailed history of the animal and the bite injury.  Perform a wound exam.  Take your medical history. Blood tests or X-rays may be performed. Sometimes, infected bite wounds are cultured and sent to a lab to identify the infectious bacteria.  TREATMENT  Medical treatment will depend on the location and type of animal bite as well as the patient's medical history. Treatment may include:  Wound care, such as cleaning and flushing the wound with saline solution, bandaging, and elevating the affected area.  Antibiotics.  Tetanus immunization.  Rabies immunization.  Leaving the wound open to heal. This is often done with animal bites, due to the high risk of infection. However, in certain cases, wound closure with stitches, wound adhesive, skin adhesive strips, or staples may be used. Infected bites that are left untreated may require intravenous (IV) antibiotics and surgical treatment in the hospital. [place]  Follow your caregiver's instructions for wound care.  Take all medicines as directed.  If your caregiver prescribes antibiotics, take them as directed. Finish them even if you start to feel better.  Follow up with your caregiver for further exams or immunizations as directed. You may need a tetanus shot if:  You cannot remember when you had your last tetanus  shot.  You have never had a tetanus shot.  The injury broke your skin. If you get a tetanus shot, your arm may swell, get red, and feel warm to the touch. This is common and not a problem. If you need a tetanus shot and you choose not to have one, there is a rare chance of getting tetanus. Sickness from tetanus can be serious. SEEK MEDICAL CARE IF:  You notice warmth, redness, soreness, swelling, pus discharge, or a bad smell coming from the wound.  You have a red line on the  skin coming from the wound.  You have a fever, chills, or a general ill feeling.  You have nausea or vomiting.  You have continued or worsening pain.  You have trouble moving the injured part.  You have other questions or concerns. MAKE SURE YOU:  Understand these instructions.  Will watch your condition.  Will get help right away if you are not doing well or get worse. Document Released: 10/04/2010 Document Revised: 04/10/2011 Document Reviewed: 10/04/2010 [facility] Patient Information 2015 [place], [place]. This information is not intended to replace advice given to you by your health care provider. Make sure you discuss any questions you have with your health care provider.

## 2013-12-08 NOTE — ED Provider Notes (Signed)
CSN: [id number]     Arrival date & time 12/08/13  1539 History  This chart was scribed for non-physician practitioner, Clayton Bibles, PA-C working with Richarda Blade, MD by Frederich Balding, ED scribe. This patient was seen in room WTR6/WTR6 and the patient's care was started at 4:08 PM.   Chief Complaint  Patient presents with  . Animal Bite  . Hand Injury   The history is provided by the patient. No language interpreter was used.    HPI Comments: Andrew Li is a 48 y.o. male who presents to the Emergency Department complaining of a dog bite to his left hand that occurred 2 days ago. States he was trying to break up a fight between his dogs and was accidentally bitten. His dogs are up to date on vaccinations. States he was evalauted at his PCP and was placed on Augmentin. He has had two doses. Reports increased pain, swelling and redness to his hand so he went back to his PCP today and was told to come to the ED due to failing out-patient treatment. Pain is starting to radiate into his arm. Also reports associated nausea. Denies fever, trouble swallowing, difficulty breathing, numbness or tingling. Pt's last tetanus was 2 years ago. Pt is right hand dominant.    Past Medical History  Diagnosis Date  . Hypertension   . Anxiety   . Tinnitus   . Gilbert's disease   . Family history of heart disease    Past Surgical History  Procedure Laterality Date  . Adenoidectomy    . Nasal sinus surgery    . Transthoracic echocardiogram  09/2005    borderline conc LVH; LA mildly dilated; trace MR/TR  . Cardiovascular stress test  09/28/2004    no evidence of perfusion abnormality, EF 74%   Family History  Problem Relation Age of Onset  . Hypertension Father 62  . Coronary artery disease Father   . Diabetes Father 44   History  Substance Use Topics  . Smoking status: Former Smoker -- 4 years    Quit date: 09/13/1994  . Smokeless tobacco: Not on file  . Alcohol Use: No    Review of  Systems  Constitutional: Negative for fever.  HENT: Negative for trouble swallowing.   Gastrointestinal: Positive for nausea.  Musculoskeletal: Positive for joint swelling and arthralgias.  Skin: Positive for color change and wound.  Neurological: Negative for numbness.  All other systems reviewed and are negative.  Allergies  Mango flavor; Shrimp; Citrus; Omeprazole; Pepcid; and Prilosec  Home Medications   Prior to Admission medications   Medication Sig Start Date End Date Taking? Authorizing Provider  aspirin 325 MG tablet Take 325 mg by mouth as needed.   Yes Historical Provider, MD  Calcium Carbonate-Vitamin D (CALCIUM PLUS VITAMIN D PO) Take by mouth.   Yes Historical Provider, MD  Cholecalciferol (VITAMIN D-3) 5000 UNITS TABS Take 1 tablet by mouth daily.    Yes Historical Provider, MD  ibuprofen (ADVIL,MOTRIN) 200 MG tablet Take 600 mg by mouth every 6 (six) hours as needed. For pain   Yes Historical Provider, MD  magnesium oxide (MAG-OX) 400 MG tablet Take 400 mg by mouth daily.   Yes Historical Provider, MD  MANGANESE PO Take 3 mg by mouth.   Yes Historical Provider, MD  Niacin (VITAMIN B-3 PO) Take 25 mg by mouth.   Yes Historical Provider, MD  OVER THE COUNTER MEDICATION Take 1 tablet by mouth daily. Curcumin Po   Yes Historical  Provider, MD  Pyridoxine HCl (VITAMIN B-6 PO) Take 15 mg by mouth.   Yes Historical Provider, MD  Riboflavin (VITAMIN B2 PO) Take 20 mg by mouth.   Yes Historical Provider, MD  thiamine (VITAMIN B-1) 100 MG tablet Take 100 mg by mouth daily.   Yes Historical Provider, MD  VITAMIN A PO Take 1 tablet by mouth daily. 200 iu   Yes Historical Provider, MD  Zinc 50 MG CAPS Take 1 capsule by mouth daily.    Yes Historical Provider, MD  Biotin 10 MG TABS Take 30 mg by mouth.    Historical Provider, MD  calcium citrate-vitamin D (CITRACAL+D) 315-200 MG-UNIT per tablet Take 1 tablet by mouth 2 (two) times daily.    Historical Provider, MD  Docosahexaenoic  Acid (DHA PO) Take 70 mg by mouth.    Historical Provider, MD  folic acid (FOLVITE) 924 MCG tablet Take 400 mcg by mouth daily.    Historical Provider, MD  Omega-3 Fatty Acids (EPA PO) Take 80 mg by mouth.    Historical Provider, MD  Potassium (POTASSIMIN PO) Take 50 mg by mouth.    Historical Provider, MD   BP 183/100 mmHg  Pulse 72  Temp(Src) 98 F (36.7 C) (Oral)  Resp 16  SpO2 97%   Physical Exam  Constitutional: He appears well-developed and well-nourished. No distress.  HENT:  Head: Normocephalic and atraumatic.  Neck: Neck supple.  Pulmonary/Chest: Effort normal.  Musculoskeletal: He exhibits edema and tenderness.  Left hand with erythema, edema, warmth, tenderness over dorsal aspect going just into the proximal aspect of his second through fifth fingers. Small puncture wound on radial side of third MCP with mild associated induration. No discharge. Distal sensation intact. Cap refill less than 2 seconds. Full active ROM of all fingers but 5/10 pain with full flexion.   Neurological: He is alert.  Skin: He is not diaphoretic.  Nursing note and vitals reviewed.      ED Course  Procedures (including critical care time)  DIAGNOSTIC STUDIES: Oxygen Saturation is 97% on RA, normal by my interpretation.    COORDINATION OF CARE: 4:11 PM-Discussed treatment plan which includes hand surgery consult with pt at bedside and pt agreed to plan.   Labs Review Labs Reviewed  CBC WITH DIFFERENTIAL    Imaging Review Dg Hand Complete Left  12/08/2013   CLINICAL DATA:  Two day history of swelling from dog bite  EXAM: LEFT HAND - COMPLETE 3+ VIEW  COMPARISON:  None.  FINDINGS: Frontal, oblique, and lateral views were obtained. No acute fracture or dislocation. There is evidence of old trauma in the ulnar styloid region with chronic avulsion in this area. There is slight narrowing of all PIP joints. There is no erosive change. No bony destruction. No radiopaque foreign body. No soft  tissue abscess seen.  IMPRESSION: Old trauma involving the ulnar styloid. No acute fracture dislocation. No erosive change or bony destruction. No soft tissue abscess. No radiopaque foreign body.   Electronically Signed   By: Lowella Grip M.D.   On: 12/08/2013 17:10     EKG Interpretation None       Discussed with Dr Eulis Foster who has also seen and examined the patient.  Recommends discussion with hand surgery, possible discharge home with close follow up.  5:13 PM Discussed pt with Dr Bertis Ruddy PA, Herbie Baltimore.  He request CBC  5:13 PM Per hand surgery PA Herbie Baltimore), pt will be seen in Dr Bertis Ruddy office tomorrow morning.  He is to  come at 9:30am for a 10:00 appointment.  To be NPO after midnight tonight.      MDM   Final diagnoses:  Dog bite of left hand with infection, initial encounter   Afebrile, nontoxic patient with infected dog bite sustained two days ago.  Has taken only two doses of augmentin.  Pt does have pain with flexing and extending hand, but he has full AROM.  Neurovascularly intact.  No streaking, no fevers.  Dr Eulis Foster has also examined the patient.  Low likelihood of tendon/tendon sheath involvement, discussed with hand surgery PA who returned page.    Pt declined stronger pain medication than ibuprofen, which is controlling his pain at home.  D/C home with instructions for follow up with Dr Burney Gauze tomorrow morning, as above.  Discussed result, findings, treatment, and follow up  with patient.  Pt given return precautions.  Pt verbalizes understanding and agrees with plan.       I personally performed the services described in this documentation, which was scribed in my presence. The recorded information has been reviewed and is accurate.  Clayton Bibles, PA-C 12/08/13 [place], MD 12/09/13 1024

## 2013-12-08 NOTE — ED Notes (Addendum)
Pt sts his dogs got into a fight, pt tried to break it up and was bit by his chocolate lab on 9pm Saturday night. Pt has noticed increased swelling, shooting pains up his arm and has had difficulty extending his fingers.

## 2013-12-11 ENCOUNTER — Encounter: Payer: Self-pay | Admitting: Cardiovascular Disease

## 2014-01-06 ENCOUNTER — Encounter: Payer: Self-pay | Admitting: Cardiovascular Disease

## 2014-02-22 ENCOUNTER — Encounter (HOSPITAL_COMMUNITY): Payer: Self-pay

## 2014-02-22 ENCOUNTER — Emergency Department (HOSPITAL_COMMUNITY)
Admission: EM | Admit: 2014-02-22 | Discharge: 2014-02-22 | Disposition: A | Discharge status: Home / Self Care | Payer: BLUE CROSS/BLUE SHIELD | Attending: Emergency Medicine | Admitting: Emergency Medicine

## 2014-02-22 DIAGNOSIS — I159 Secondary hypertension, unspecified: Secondary | ICD-10-CM

## 2014-02-22 DIAGNOSIS — F419 Anxiety disorder, unspecified: Secondary | ICD-10-CM | POA: Insufficient documentation

## 2014-02-22 DIAGNOSIS — Z87891 Personal history of nicotine dependence: Secondary | ICD-10-CM | POA: Diagnosis not present

## 2014-02-22 DIAGNOSIS — Z8639 Personal history of other endocrine, nutritional and metabolic disease: Secondary | ICD-10-CM | POA: Insufficient documentation

## 2014-02-22 DIAGNOSIS — R002 Palpitations: Secondary | ICD-10-CM | POA: Insufficient documentation

## 2014-02-22 DIAGNOSIS — Z79899 Other long term (current) drug therapy: Secondary | ICD-10-CM | POA: Insufficient documentation

## 2014-02-22 DIAGNOSIS — R51 Headache: Secondary | ICD-10-CM | POA: Insufficient documentation

## 2014-02-22 DIAGNOSIS — I10 Essential (primary) hypertension: Secondary | ICD-10-CM | POA: Diagnosis present

## 2014-02-22 MED ORDER — CLONIDINE HCL 0.1 MG PO TABS
0.1000 mg | ORAL_TABLET | Freq: Once | ORAL | Status: AC
  Administered 2014-02-22: 0.1 mg via ORAL
  Filled 2014-02-22: qty 1

## 2014-02-22 NOTE — ED Notes (Signed)
Per pt, went to donate blood earlier in the week and bp was elevated.  Went to clinic that night and bp also elevated.  Pt went to primary care and bp also elevated on Thursday.  Pt checked bp the next day and still elevated.  This morning bp again elevated.  Feels very anxious and woke up with anxiety.  Has hx of HTN but not on any meds.  Primary MD, sent him home with a form and stated keep track and send results to her within a week.  Pt also c/o palpitations but is not new.  Started a year ago.

## 2014-02-22 NOTE — Discharge Instructions (Signed)
Hypertension Hypertension, commonly called high blood pressure, is when the force of blood pumping through your arteries is too strong. Your arteries are the blood vessels that carry blood from your heart throughout your body. A blood pressure reading consists of a higher number over a lower number, such as 110/72. The higher number (systolic) is the pressure inside your arteries when your heart pumps. The lower number (diastolic) is the pressure inside your arteries when your heart relaxes. Ideally you want your blood pressure below 120/80. Hypertension forces your heart to work harder to pump blood. Your arteries may become narrow or stiff. Having hypertension puts you at risk for heart disease, stroke, and other problems.  RISK FACTORS Some risk factors for high blood pressure are controllable. Others are not.  Risk factors you cannot control include:   Race. You may be at higher risk if you are African American.  Age. Risk increases with age.  Gender. Men are at higher risk than women before age 14 years. After age 62, women are at higher risk than men. Risk factors you can control include:  Not getting enough exercise or physical activity.  Being overweight.  Getting too much fat, sugar, calories, or salt in your diet.  Drinking too much alcohol. SIGNS AND SYMPTOMS Hypertension does not usually cause signs or symptoms. Extremely high blood pressure (hypertensive crisis) may cause headache, anxiety, shortness of breath, and nosebleed. DIAGNOSIS  To check if you have hypertension, your health care provider will measure your blood pressure while you are seated, with your arm held at the level of your heart. It should be measured at least twice using the same arm. Certain conditions can cause a difference in blood pressure between your right and left arms. A blood pressure reading that is higher than normal on one occasion does not mean that you need treatment. If one blood pressure  reading is high, ask your health care provider about having it checked again. TREATMENT  Treating high blood pressure includes making lifestyle changes and possibly taking medicine. Living a healthy lifestyle can help lower high blood pressure. You may need to change some of your habits. Lifestyle changes may include:  Following the DASH diet. This diet is high in fruits, vegetables, and whole grains. It is low in salt, red meat, and added sugars.  Getting at least 2 hours of brisk physical activity every week.  Losing weight if necessary.  Not smoking.  Limiting alcoholic beverages.  Learning ways to reduce stress. If lifestyle changes are not enough to get your blood pressure under control, your health care provider may prescribe medicine. You may need to take more than one. Work closely with your health care provider to understand the risks and benefits. HOME CARE INSTRUCTIONS  Have your blood pressure rechecked as directed by your health care provider.   Take medicines only as directed by your health care provider. Follow the directions carefully. Blood pressure medicines must be taken as prescribed. The medicine does not work as well when you skip doses. Skipping doses also puts you at risk for problems.   Do not smoke.   Monitor your blood pressure at home as directed by your health care provider. SEEK MEDICAL CARE IF:   You think you are having a reaction to medicines taken.  You have recurrent headaches or feel dizzy.  You have swelling in your ankles.  You have trouble with your vision. SEEK IMMEDIATE MEDICAL CARE IF:  You develop a severe headache or confusion.  You have unusual weakness, numbness, or feel faint.  You have severe chest or abdominal pain.  You vomit repeatedly.  You have trouble breathing. MAKE SURE YOU:   Understand these instructions.  Will watch your condition.  Will get help right away if you are not doing well or get  worse. Document Released: 01/16/2005 Document Revised: 06/02/2013 Document Reviewed: 11/08/2012 [facility] Patient Information 2015 [place], [place]. This information is not intended to replace advice given to you by your health care provider. Make sure you discuss any questions you have with your health care provider.  How to Take Your Blood Pressure HOW DO I GET A BLOOD PRESSURE MACHINE?  You can buy an electronic home blood pressure machine at your local pharmacy. Insurance will sometimes cover the cost if you have a prescription.  Ask your doctor what type of machine is best for you. There are different machines for your arm and your wrist.  If you decide to buy a machine to check your blood pressure on your arm, first check the size of your arm so you can buy the right size cuff. To check the size of your arm:   Use a measuring tape that shows both inches and centimeters.   Wrap the measuring tape around the upper-middle part of your arm. You may need someone to help you measure.   Write down your arm measurement in both inches and centimeters.   To measure your blood pressure correctly, it is important to have the right size cuff.   If your arm is up to 13 inches (up to 34 centimeters), get an adult cuff size.  If your arm is 13 to 17 inches (35 to 44 centimeters), get a large adult cuff size.    If your arm is 17 to 20 inches (45 to 52 centimeters), get an adult thigh cuff.  WHAT DO THE NUMBERS MEAN?   There are two numbers that make up your blood pressure. For example: 120/80.  The first number (120 in our example) is called the "systolic pressure." It is a measure of the pressure in your blood vessels when your heart is pumping blood.  The second number (80 in our example) is called the "diastolic pressure." It is a measure of the pressure in your blood vessels when your heart is resting between beats.  Your doctor will tell you what your blood pressure should be. WHAT  SHOULD I DO BEFORE I CHECK MY BLOOD PRESSURE?   Try to rest or relax for at least 30 minutes before you check your blood pressure.  Do not smoke.  Do not have any drinks with caffeine, such as:  Soda.  Coffee.  Tea.  Check your blood pressure in a quiet room.  Sit down and stretch out your arm on a table. Keep your arm at about the level of your heart. Let your arm relax.  Make sure that your legs are not crossed. HOW DO I CHECK MY BLOOD PRESSURE?  Follow the directions that came with your machine.  Make sure you remove any tight-fighting clothing from your arm or wrist. Wrap the cuff around your upper arm or wrist. You should be able to fit a finger between the cuff and your arm. If you cannot fit a finger between the cuff and your arm, it is too tight and should be removed and rewrapped.  Some units require you to manually pump up the arm cuff.  Automatic units inflate the cuff when you press a  button.  Cuff deflation is automatic in both models.  After the cuff is inflated, the unit measures your blood pressure and pulse. The readings are shown on a monitor. Hold still and breathe normally while the cuff is inflated.  Getting a reading takes less than a minute.  Some models store readings in a memory. Some provide a printout of readings. If your machine does not store your readings, keep a written record.  Take readings with you to your next visit with your doctor. Document Released: 12/30/2007 Document Revised: 06/02/2013 Document Reviewed: 03/13/2013 [facility] Patient Information 2015 [place], [place]. This information is not intended to replace advice given to you by your health care provider. Make sure you discuss any questions you have with your health care provider.

## 2014-02-22 NOTE — ED Provider Notes (Signed)
CSN: [id number]     Arrival date & time 02/22/14  1037 History   First MD Initiated Contact with Patient 02/22/14 1041     Chief Complaint  Patient presents with  . Hypertension     (Consider location/radiation/quality/duration/timing/severity/associated sxs/prior Treatment) HPI  Pt is a 49yo male with hx of HTN, anxiety, tinnitus, Gilbert's disease, and FH of heart disease, presenting to ED with c/o feeling anxious this morning with a mild frontal headache, 3/10. Headache was gradual in onset, similar to previous. States he could have taken ibuprofen for his headache but did not want his BP to be any higher. Denies recent head injuries. Pt states he was seen by his PCP on Thursday, 02/19/14, advised to keep track of his home BP and f/u in 1 week. Pt states his BP has only become higher as the weekend has progressed. Pt was not started on BP medication at that time. Pt reports mild palpitations but states they started over 1 year ago. Denies chest pain or SOB.  States he use to be on BP medication 2-3 years ago but was only having side effects so he decided to stop the medications and use diet and exercise which did help until recently.  He reports taking hydrochlorothiazide, lisinopril, losartan, amlodipine and possibly some other medications he cannot recall at this time but claims none of the medications had worked.  States his PCP has checked his renal function as well as thyroid, both were normal.   Past Medical History  Diagnosis Date  . Hypertension   . Anxiety   . Tinnitus   . Gilbert's disease   . Family history of heart disease    Past Surgical History  Procedure Laterality Date  . Adenoidectomy    . Nasal sinus surgery    . Transthoracic echocardiogram  09/2005    borderline conc LVH; LA mildly dilated; trace MR/TR  . Cardiovascular stress test  09/28/2004    no evidence of perfusion abnormality, EF 74%   Family History  Problem Relation Age of Onset  . Hypertension Father 79   . Coronary artery disease Father   . Diabetes Father 73   History  Substance Use Topics  . Smoking status: Former Smoker -- 4 years    Quit date: 09/13/1994  . Smokeless tobacco: Not on file  . Alcohol Use: No    Review of Systems  Constitutional: Negative for fever, chills and fatigue.  HENT: Negative for congestion.   Respiratory: Negative for cough and shortness of breath.   Cardiovascular: Positive for palpitations. Negative for chest pain.  Gastrointestinal: Negative for nausea, vomiting, abdominal pain and diarrhea.  Neurological: Positive for headaches. Negative for dizziness, seizures, syncope, facial asymmetry, speech difficulty, weakness, light-headedness and numbness.  All other systems reviewed and are negative.     Allergies  Mango flavor; Shrimp; Citrus; Omeprazole; Pepcid; and Prilosec  Home Medications   Prior to Admission medications   Medication Sig Start Date End Date Taking? Authorizing Provider  ALPRAZolam Duanne Moron) 0.5 MG tablet Take 0.25-0.5 mg by mouth daily as needed for anxiety.   Yes Historical Provider, MD  aspirin 325 MG tablet Take 325 mg by mouth as needed for mild pain.    Yes Historical Provider, MD  Calcium Carbonate-Vitamin D (CALCIUM PLUS VITAMIN D PO) Take 1 tablet by mouth daily.    Yes Historical Provider, MD  Cholecalciferol (VITAMIN D-3) 5000 UNITS TABS Take 1 tablet by mouth daily.    Yes Historical Provider, MD  Cyanocobalamin (  VITAMIN B 12 PO) Take 1 tablet by mouth daily.   Yes Historical Provider, MD  ibuprofen (ADVIL,MOTRIN) 200 MG tablet Take 600 mg by mouth every 6 (six) hours as needed. For pain   Yes Historical Provider, MD  magnesium oxide (MAG-OX) 400 MG tablet Take 400 mg by mouth daily.   Yes Historical Provider, MD  Multiple Vitamins-Minerals (VITEYES AREDS FORMULA PO) Take 1 tablet by mouth daily.   Yes Historical Provider, MD  OVER THE COUNTER MEDICATION Take 1 tablet by mouth daily. Curcumin Po   Yes Historical Provider,  MD  Pyridoxine HCl (VITAMIN B-6 PO) Take 1 tablet by mouth daily.    Yes Historical Provider, MD  Riboflavin (VITAMIN B2 PO) Take 20 mg by mouth.   Yes Historical Provider, MD  thiamine (VITAMIN B-1) 100 MG tablet Take 100 mg by mouth daily.   Yes Historical Provider, MD  VITAMIN A PO Take 2,000 Units by mouth daily. 200 iu   Yes Historical Provider, MD  Zinc 50 MG CAPS Take 1 capsule by mouth daily.    Yes Historical Provider, MD   BP 175/106 mmHg  Pulse 73  Temp(Src) 97.2 F (36.2 C) (Oral)  Resp 18  SpO2 96% Physical Exam  Constitutional: He appears well-developed and well-nourished.  Pt sitting on side of exam bed, appears well. Mildly anxious.  HENT:  Head: Normocephalic and atraumatic.  Eyes: Conjunctivae are normal. No scleral icterus.  Neck: Normal range of motion.  Cardiovascular: Normal rate, regular rhythm and normal heart sounds.   Pulmonary/Chest: Effort normal and breath sounds normal. No respiratory distress. He has no wheezes. He has no rales. He exhibits no tenderness.  Abdominal: Soft. Bowel sounds are normal. He exhibits no distension and no mass. There is no tenderness. There is no rebound and no guarding.  Musculoskeletal: Normal range of motion.  Neurological: He is alert.  Skin: Skin is warm and dry.  Psychiatric: His speech is normal and behavior is normal. Thought content normal. His mood appears anxious ( mild).  Nursing note and vitals reviewed.   ED Course  Procedures (including critical care time) Labs Review Labs Reviewed - No data to display  Imaging Review No results found.   EKG Interpretation None      MDM   Final diagnoses:  Secondary hypertension, unspecified    Pt with known HTN presenting to ED with concern for elevated BP. Pt reports mild frontal headache and feeling anxious this morning. Denies chest pain or SOB.  Pt appears well, mildly anxious. BP 181/107. Pt currently keeping a BP log for his PCP as recommended by his PCP 4  days ago. Not concerned for ACS, CVA or other emergent process taking place at this time.  Pt reports normal thyroid and kidney function per PCP. Discussed pt with Dr. Eulis Foster, encouraged pt to continue keeping a log of his BP and to call his PCP in the morning to f/u as pt states he has already been on multiple BP medications in the past that did not help.  Pt given 0.1mg  clonidine in ED, BP improved to 175/106 within 1 hour. Pt appears well, hemodynamically stable for discharge home. Return precautions provided. Pt verbalized understanding and agreement with tx plan.    Noland Fordyce, PA-C 02/22/14 [place], MD 02/22/14 [phone]

## 2014-05-05 ENCOUNTER — Encounter (HOSPITAL_COMMUNITY): Payer: Self-pay | Admitting: *Deleted

## 2014-05-05 ENCOUNTER — Other Ambulatory Visit: Payer: Self-pay | Admitting: Urology

## 2014-05-06 ENCOUNTER — Other Ambulatory Visit: Payer: Self-pay | Admitting: Urology

## 2014-05-07 ENCOUNTER — Ambulatory Visit (HOSPITAL_COMMUNITY): Payer: BLUE CROSS/BLUE SHIELD

## 2014-05-07 ENCOUNTER — Ambulatory Visit (HOSPITAL_COMMUNITY)
Admission: RE | Admit: 2014-05-07 | Discharge: 2014-05-07 | Disposition: A | Discharge status: Home / Self Care | Payer: BLUE CROSS/BLUE SHIELD | Source: Ambulatory Visit | Attending: Urology | Admitting: Urology

## 2014-05-07 ENCOUNTER — Encounter (HOSPITAL_COMMUNITY): Payer: Self-pay | Admitting: *Deleted

## 2014-05-07 ENCOUNTER — Encounter (HOSPITAL_COMMUNITY)
Admission: RE | Disposition: A | Discharge status: Home / Self Care | Payer: Self-pay | Source: Ambulatory Visit | Attending: Urology

## 2014-05-07 DIAGNOSIS — N1330 Unspecified hydronephrosis: Secondary | ICD-10-CM | POA: Insufficient documentation

## 2014-05-07 DIAGNOSIS — Z79891 Long term (current) use of opiate analgesic: Secondary | ICD-10-CM | POA: Insufficient documentation

## 2014-05-07 DIAGNOSIS — Z87891 Personal history of nicotine dependence: Secondary | ICD-10-CM | POA: Diagnosis not present

## 2014-05-07 DIAGNOSIS — K219 Gastro-esophageal reflux disease without esophagitis: Secondary | ICD-10-CM | POA: Diagnosis not present

## 2014-05-07 DIAGNOSIS — G4730 Sleep apnea, unspecified: Secondary | ICD-10-CM | POA: Diagnosis not present

## 2014-05-07 DIAGNOSIS — F419 Anxiety disorder, unspecified: Secondary | ICD-10-CM | POA: Insufficient documentation

## 2014-05-07 DIAGNOSIS — Z79899 Other long term (current) drug therapy: Secondary | ICD-10-CM | POA: Diagnosis not present

## 2014-05-07 DIAGNOSIS — I10 Essential (primary) hypertension: Secondary | ICD-10-CM | POA: Diagnosis not present

## 2014-05-07 DIAGNOSIS — Z8619 Personal history of other infectious and parasitic diseases: Secondary | ICD-10-CM | POA: Diagnosis not present

## 2014-05-07 DIAGNOSIS — N201 Calculus of ureter: Secondary | ICD-10-CM | POA: Diagnosis not present

## 2014-05-07 DIAGNOSIS — Z87442 Personal history of urinary calculi: Secondary | ICD-10-CM | POA: Diagnosis not present

## 2014-05-07 DIAGNOSIS — N400 Enlarged prostate without lower urinary tract symptoms: Secondary | ICD-10-CM | POA: Insufficient documentation

## 2014-05-07 HISTORY — DX: Headache, unspecified: R51.9

## 2014-05-07 HISTORY — DX: Personal history of urinary calculi: Z87.442

## 2014-05-07 HISTORY — DX: Headache: R51

## 2014-05-07 SURGERY — LITHOTRIPSY, ESWL
Anesthesia: LOCAL | Laterality: Right

## 2014-05-07 MED ORDER — CIPROFLOXACIN HCL 500 MG PO TABS
500.0000 mg | ORAL_TABLET | ORAL | Status: AC
  Administered 2014-05-07: 500 mg via ORAL
  Filled 2014-05-07: qty 1

## 2014-05-07 MED ORDER — DIAZEPAM 5 MG PO TABS
10.0000 mg | ORAL_TABLET | ORAL | Status: AC
  Administered 2014-05-07: 10 mg via ORAL
  Filled 2014-05-07: qty 2

## 2014-05-07 MED ORDER — DIPHENHYDRAMINE HCL 25 MG PO CAPS
25.0000 mg | ORAL_CAPSULE | ORAL | Status: AC
  Administered 2014-05-07: 25 mg via ORAL
  Filled 2014-05-07: qty 1

## 2014-05-07 MED ORDER — DEXTROSE-NACL 5-0.45 % IV SOLN
INTRAVENOUS | Status: DC
  Administered 2014-05-07: 16:00:00 via INTRAVENOUS

## 2014-05-07 NOTE — Op Note (Signed)
See [facility] OP note scanned into chart. 

## 2014-05-07 NOTE — Discharge Instructions (Signed)
Refer to [facility] Discharge Instructions    Conscious Sedation, Adult, Care After   Refer to this sheet in the next few weeks. These instructions provide you with information on caring for yourself after your procedure. Your health care provider may also give you more specific instructions. Your treatment has been planned according to current medical practices, but problems sometimes occur. Call your health care provider if you have any problems or questions after your procedure. WHAT TO EXPECT AFTER THE PROCEDURE  After your procedure:  You may feel sleepy, clumsy, and have poor balance for several hours.  Vomiting may occur if you eat too soon after the procedure. HOME CARE INSTRUCTIONS  Do not participate in any activities where you could become injured for at least 24 hours. Do not:  Drive.  Swim.  Ride a bicycle.  Operate heavy machinery.  Cook.  Use power tools.  Climb ladders.  Work from a high place.  Do not make important decisions or sign legal documents until you are improved.  If you vomit, drink water, juice, or soup when you can drink without vomiting. Make sure you have little or no nausea before eating solid foods.  Only take over-the-counter or prescription medicines for pain, discomfort, or fever as directed by your health care provider.  Make sure you and your family fully understand everything about the medicines given to you, including what side effects may occur.  You should not drink alcohol, take sleeping pills, or take medicines that cause drowsiness for at least 24 hours.  If you smoke, do not smoke without supervision.  If you are feeling better, you may resume normal activities 24 hours after you were sedated.  Keep all appointments with your health care provider. SEEK MEDICAL CARE IF:  Your skin is pale or bluish in color.  You continue to feel nauseous or vomit.  Your pain is getting worse and is not helped by  medicine.  You have bleeding or swelling.  You are still sleepy or feeling clumsy after 24 hours. SEEK IMMEDIATE MEDICAL CARE IF:  You develop a rash.  You have difficulty breathing.  You develop any type of allergic problem.  You have a fever. MAKE SURE YOU:  Understand these instructions.  Will watch your condition.  Will get help right away if you are not doing well or get worse. Document Released: 11/06/2012 Document Reviewed: 11/06/2012 [facility] Patient Information 2015 [place], [place]. This information is not intended to replace advice given to you by your health care provider. Make sure you discuss any questions you have with your health care provider.

## 2014-05-07 NOTE — H&P (Signed)
Active Problems Problems  1. Benign prostatic hypertrophy without lower urinary tract symptoms (N40.0) 2. Calculus of ureter (N20.1) 3. Hypertension (I10) 4. Nephrolithiasis (N20.0) 5. Family history of Prostate Cancer  History of Present Illness Andrew Li returns today in f/u for his history of a 47m right proximal stone for which he was seen in January. He is on MET. He has not seen it pass and has intermittent deep pain that is worse at night and relieved with vicodin. He has had no hematuria or voiding complaints.   Past Medical History Problems  1. History of Anxiety (F41.9) 2. History of Gilbert's Syndrome 3. History of esophageal reflux (Z87.19) 4. History of hematuria (Z87.448) 5. History of hepatitis (Z86.19) 6. History of sleep apnea (Z87.09) 7. History of syncope (Z87.898) 8. History of Hypoglycemia  Surgical History Problems  1. History of Adenoidectomy 2. History of Nasal Septal Deviation Repair  Current Meds 1. Betaine HCl TABS;  Therapy: (Recorded:26Sep2014) to Recorded 2. Doxazosin Mesylate 4 MG Oral Tablet; Take 1/2 tablet for the first 4 days and then a whole  tablet thereafter;  Therapy: [id number] (Last Rx:16Feb2016)  Requested for: [id number] 3. Hydrocodone-Acetaminophen 5-325 MG Oral Tablet; TAKE 1 TO 2 TABLETS EVERY 4 TO  6 HOURS AS NEEDED FOR PAIN;  Therapy: 26Sep2014 to (Evaluate:26Oct2014); Last Rx:26Sep2014 Ordered 4. Magnesium Oxide 400 MG Oral Tablet;  Therapy: (Recorded:26Sep2014) to Recorded 5. Vitamin A TABS;  Therapy: (Recorded:26Sep2014) to Recorded 6. Vitamin B Complex Oral Tablet;  Therapy: (Recorded:09Oct2014) to Recorded 7. Vitamin D-3 TABS;  Therapy: (Recorded:26Sep2014) to Recorded 8. Vitamin E TABS;  Therapy: (Recorded:26Sep2014) to Recorded 9. Xanax TABS;  Therapy: (Recorded:25Jan2016) to Recorded 10. Zinc Sulfate CAPS;   Therapy: (Recorded:26Sep2014) to Recorded 11. ZyrTEC Allergy 10 MG Oral Tablet;   Therapy:  (Recorded:26Sep2014) to Recorded  Allergies Medication  1. Doxycycline Hyclate CAPS 2. [place] Family History Problems  1. Family history of Diabetes Mellitus : Father 2. Family history of Family Health Status - Father's Age   age 14415 3 Family history of Family Health Status - Mother's Age   age 14433 4 Family history of Hypertension : Father 528 Family history of Prostate Cancer : Paternal Uncle 6. Family history of Sarcoidosis : Mother  Social History Problems  1. Alcohol Use   0-2 per week 2. Caffeine Use   1 coffee per day 3. Marital History - Single 4. 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 Tobacco use (Z72.0)   smoked <164yruit 2054yrgo  Review of Systems  Genitourinary: testicular pain.  Gastrointestinal: nausea (a couple of times)   The patient presents with complaints of diarrhea (occasional).  Constitutional: no fever.    Vitals Vital Signs [Data Includes: Last 1 Day]  Recorded: 01Apr2016 11:17AM  Blood Pressure: 148 / 100 Temperature: 97.8 F Heart Rate: 98  Physical Exam Constitutional: Well nourished and well developed . No acute distress.  Pulmonary: No respiratory distress and normal respiratory rhythm and effort.  Cardiovascular: Heart rate and rhythm are normal . No peripheral edema.    Results/Data Urine [Data Includes: Last 1 Day]   01Apr2016  COLOR YELLOW   APPEARANCE CLEAR   SPECIFIC GRAVITY 1.015   pH 6.0   GLUCOSE NEG mg/dL  BILIRUBIN NEG   KETONE NEG mg/dL  BLOOD TRACE   PROTEIN NEG mg/dL  UROBILINOGEN 0.2 mg/dL  NITRITE NEG   LEUKOCYTE ESTERASE NEG   SQUAMOUS EPITHELIAL/HPF RARE   WBC 0-2 WBC/hpf  RBC 0-2 RBC/hpf  BACTERIA  NONE SEEN   CRYSTALS NONE SEEN   CASTS NONE SEEN    The following images/tracing/specimen were independently visualized:  KUB today shows his 29m stone is now just below L4 on the right. There is an apparent metallic stippled artifact in the right pelvis along with a small phlebolith. No  other abnormalities are noted. Right renal UKoreashows a 12.5cm kidney with moderate hydro and a dilated proximal ureter with a 4.955mstone noted in the ureter. No masses or renal stones were noted. He has a normal bladder that emptied on a second void.  The following clinical lab reports were reviewed:  UA reviewed.    Assessment Assessed  1. Calculus of ureter (N20.1) 2. Hydronephrosis (N13.30) 3. Hypertension (I10)  He has had minimal progression of his stone over the last 3 months and has persistent hydro.  He continues to have difficulty with BP control and is currently on doxazosin 1258maily.   Plan Calculus of ureter  1. Follow-up NP/PA Diane Office  Follow-up  Status: Hold For - Appointment,Date of Service   Requested for: 01Apr2016 2. Follow-up Schedule Surgery Office  Follow-up  Status: Hold For - Appointment   Requested for: 01Apr2016 3. KUB; Status:Hold For - Appointment,Date of Service; Requested for:01Apr2016;  4. RENAL U/S RIGHT; Status:Resulted - Requires Verification;   Done: 01Apr2016 12:00AM Health Maintenance  5. UA With REFLEX; [Do Not Release]; Status:Resulted - Requires Verification;   Done:  01Apr2016 10:54AM  I discussed ureteroscopy vs ESWL for the stone and he would prefer ESWL.  I reviewed the risks of bleeding, infection, injury to adjacent organs, failure of fragmentation or need for secondary procedures, thrombotic events and sedation risks.  We will try to arrange this for 4/7 with one of my partners.   I am going to have him resume the HCTZ that he has on hand to try to get his BP under better control.

## 2014-05-28 ENCOUNTER — Encounter: Payer: Self-pay | Admitting: Cardiovascular Disease

## 2014-05-28 ENCOUNTER — Ambulatory Visit (INDEPENDENT_AMBULATORY_CARE_PROVIDER_SITE_OTHER): Payer: BLUE CROSS/BLUE SHIELD | Admitting: Cardiovascular Disease

## 2014-05-28 VITALS — BP 128/84 | HR 56 | Resp 16 | Ht 71.0 in | Wt 204.0 lb

## 2014-05-28 DIAGNOSIS — I10 Essential (primary) hypertension: Secondary | ICD-10-CM

## 2014-05-28 DIAGNOSIS — R55 Syncope and collapse: Secondary | ICD-10-CM

## 2014-05-28 DIAGNOSIS — Z8249 Family history of ischemic heart disease and other diseases of the circulatory system: Secondary | ICD-10-CM | POA: Diagnosis not present
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
## 2014-05-28 NOTE — Patient Instructions (Signed)
Dr. Sallyanne Kuster recommends that you schedule a follow-up appointment in: One year.  INCREASE your water intake.

## 2014-06-08 ENCOUNTER — Encounter (INDEPENDENT_AMBULATORY_CARE_PROVIDER_SITE_OTHER): Payer: BLUE CROSS/BLUE SHIELD

## 2014-06-08 ENCOUNTER — Encounter: Payer: Self-pay | Admitting: *Deleted

## 2014-06-08 DIAGNOSIS — R55 Syncope and collapse: Secondary | ICD-10-CM | POA: Diagnosis not present

## 2014-06-08 DIAGNOSIS — R002 Palpitations: Secondary | ICD-10-CM

## 2014-06-08 NOTE — Progress Notes (Signed)
Patient ID: Andrew Li, male   DOB: [birth year], 49 y.o.   MRN: [id number] [employer] 48 hour holter monitor applied to patient.

## 2014-11-17 ENCOUNTER — Ambulatory Visit (INDEPENDENT_AMBULATORY_CARE_PROVIDER_SITE_OTHER): Payer: BLUE CROSS/BLUE SHIELD | Admitting: Neurology

## 2014-11-17 ENCOUNTER — Other Ambulatory Visit (INDEPENDENT_AMBULATORY_CARE_PROVIDER_SITE_OTHER): Payer: BLUE CROSS/BLUE SHIELD

## 2014-11-17 ENCOUNTER — Encounter: Payer: Self-pay | Admitting: Neurology

## 2014-11-17 VITALS — BP 130/90 | HR 68 | Ht 71.0 in | Wt 204.3 lb

## 2014-11-17 DIAGNOSIS — H532 Diplopia: Secondary | ICD-10-CM

## 2014-11-17 DIAGNOSIS — R202 Paresthesia of skin: Secondary | ICD-10-CM

## 2014-11-17 DIAGNOSIS — R209 Unspecified disturbances of skin sensation: Secondary | ICD-10-CM

## 2014-11-17 DIAGNOSIS — R531 Weakness: Secondary | ICD-10-CM

## 2014-11-17 DIAGNOSIS — G43809 Other migraine, not intractable, without status migrainosus: Secondary | ICD-10-CM | POA: Diagnosis not present

## 2014-11-17 DIAGNOSIS — G43109 Migraine with aura, not intractable, without status migrainosus: Secondary | ICD-10-CM | POA: Insufficient documentation

## 2014-11-17 DIAGNOSIS — H02402 Unspecified ptosis of left eyelid: Secondary | ICD-10-CM

## 2014-11-17 LAB — VITAMIN B12: Vitamin B-12: >1500 pg/mL — ABNORMAL HIGH (ref 211–911)
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
## 2014-11-17 NOTE — Patient Instructions (Signed)
1.  Check blood work 2.  MRI brain wwo contrast 3.  EMG of the RIGHT arm and LEFT leg 4.  Telephone update with results

## 2014-11-17 NOTE — Progress Notes (Signed)
Note routed

## 2014-11-19 LAB — CERULOPLASMIN: CERULOPLASMIN: 19 mg/dL (ref 18–36)

## 2014-11-20 LAB — COPPER, SERUM: COPPER: 71 ug/dL (ref 70–175)

## 2014-11-20 LAB — ZINC: Zinc: 79 ug/dL (ref 60–130)

## 2014-11-21 LAB — MYASTHENIA GRAVIS PANEL 2
Acetylcholine Rec Binding: <0.3 nmol/L
Acetylcholine Rec Mod Ab: <1 % binding inhibition
Aceytlcholine Rec Bloc Ab: <15 % of inhibition (ref <15)

## 2014-11-21 LAB — VITAMIN B6: Vitamin B6: 33.9 ng/mL — ABNORMAL HIGH (ref 2.1–21.7)

## 2014-11-23 LAB — VITAMIN B1: VITAMIN B1 (THIAMINE): 30 nmol/L (ref 8–30)

## 2014-11-27 ENCOUNTER — Ambulatory Visit
Admission: RE | Admit: 2014-11-27 | Discharge: 2014-11-27 | Disposition: A | Discharge status: Home / Self Care | Payer: BLUE CROSS/BLUE SHIELD | Source: Ambulatory Visit | Attending: Neurology | Admitting: Neurology

## 2014-11-27 DIAGNOSIS — R209 Unspecified disturbances of skin sensation: Principal | ICD-10-CM

## 2014-11-27 DIAGNOSIS — R531 Weakness: Secondary | ICD-10-CM

## 2014-11-27 DIAGNOSIS — R202 Paresthesia of skin: Secondary | ICD-10-CM

## 2014-11-27 DIAGNOSIS — G43109 Migraine with aura, not intractable, without status migrainosus: Secondary | ICD-10-CM

## 2014-11-27 DIAGNOSIS — H532 Diplopia: Secondary | ICD-10-CM

## 2014-11-27 MED ORDER — GADOBENATE DIMEGLUMINE 529 MG/ML IV SOLN
18.0000 mL | Freq: Once | INTRAVENOUS | Status: AC | PRN
  Administered 2014-11-27: 18 mL via INTRAVENOUS

## 2014-12-01 ENCOUNTER — Encounter: Payer: BLUE CROSS/BLUE SHIELD | Admitting: Neurology

## 2014-12-01 ENCOUNTER — Telehealth: Payer: Self-pay | Admitting: Neurology

## 2014-12-01 NOTE — Telephone Encounter (Signed)
Patient called to let us know that he got stuck in a meeting at work.  He said that he is actually feeling much better.  He has rescheduled the EMG but may call in advance to cancel if his symptoms go away.

## 2014-12-01 NOTE — Telephone Encounter (Signed)
Pt needs to talk to someone about the emg please call [phone]

## 2014-12-07 ENCOUNTER — Ambulatory Visit: Payer: BLUE CROSS/BLUE SHIELD | Admitting: Neurology

## 2014-12-17 ENCOUNTER — Ambulatory Visit (INDEPENDENT_AMBULATORY_CARE_PROVIDER_SITE_OTHER): Payer: BLUE CROSS/BLUE SHIELD | Admitting: Neurology

## 2014-12-17 DIAGNOSIS — R209 Unspecified disturbances of skin sensation: Secondary | ICD-10-CM | POA: Diagnosis not present

## 2014-12-17 DIAGNOSIS — G43109 Migraine with aura, not intractable, without status migrainosus: Secondary | ICD-10-CM

## 2014-12-17 DIAGNOSIS — R202 Paresthesia of skin: Secondary | ICD-10-CM

## 2014-12-17 DIAGNOSIS — H532 Diplopia: Secondary | ICD-10-CM

## 2014-12-17 DIAGNOSIS — R531 Weakness: Secondary | ICD-10-CM

## 2014-12-17 NOTE — Procedures (Signed)
[facility]  [address], [address]  [place], [place] [address] Tel: [phone] Fax:  [phone] Test Date:  12/17/2014  Patient: Andrew Li DOB: 12-07-1965 Physician: Narda Amber  Sex: Male Height: ' " Ref Phys: Narda Amber  ID#: [id number]   Technician: [name]. Dean   Patient Complaints: This is a 49 year old gentleman referred for evaluation of generalized paresthesias of the face, hands, and lower extremities.  NCV & EMG Findings: Extensive electrodiagnostic testing of the right upper extremity and left lower extremity shows: 1. Right median and ulnar sensory and motor responses are within normal limits. 2. Left sural and superficial peroneal sensory responses are within normal limits. Peroneal and tibial motor responses are within normal limits. 3. There is no evidence of active or chronic motor axon loss changes affecting any of the tested muscles. Motor unit configuration and recruitment pattern is within normal limits.  Impression: This is a normal study of the right upper extremity and left lower extremity.   _____________________________ Narda Amber, D.O.    Nerve Conduction Studies Anti Sensory Summary Table   Stim Site NR Peak (ms) Norm Peak (ms) P-T Amp (V) Norm P-T Amp  Right Median Anti Sensory (2nd Digit)  Wrist    2.6 <3.4 31.6 >20  Left Sup Peroneal Anti Sensory (Ant Lat Mall)  12 cm    2.3 <4.5 21.7 >5  Left Sural Anti Sensory (Lat Mall)  Calf    2.7 <4.5 9.1 >5  Right Ulnar Anti Sensory (5th Digit)  Wrist    2.7 <3.1 34.2 >12   Motor Summary Table   Stim Site NR Onset (ms) Norm Onset (ms) O-P Amp (mV) Norm O-P Amp Site1 Site2 Delta-0 (ms) Dist (cm) Vel (m/s) Norm Vel (m/s)  Right Median Motor (Abd Poll Brev)  Wrist    3.1 <3.9 7.9 >6 Elbow Wrist 4.4 26.0 59 >50  Elbow    7.5  7.6         Left Peroneal Motor (Ext Dig Brev)  Ankle    3.1 <5.5 6.1 >3 B Fib Ankle 7.1 37.0 52 >40  B Fib    10.2  5.7  Poplt B Fib 1.9 10.0 53 >40    Poplt    12.1  5.3         Left Tibial Motor (Abd Hall Brev)  Ankle    3.8 <6.0 9.2 >8 Knee Ankle 8.4 40.0 48 >40  Knee    12.2  6.0         Right Ulnar Motor (Abd Dig Minimi)  Wrist    2.7 <3.1 9.0 >7 B Elbow Wrist 3.9 23.0 59 >50  B Elbow    6.6  9.0  A Elbow B Elbow 1.7 10.0 59 >50  A Elbow    8.3  8.5          EMG   Side Muscle Ins Act Fibs Psw Fasc Number Recrt Dur Dur. Amp Amp. Poly Poly. Comment  Left AntTibialis Nml Nml Nml Nml Nml Nml Nml Nml Nml Nml Nml Nml N/A  Right 1stDorInt Nml Nml Nml Nml Nml Nml Nml Nml Nml Nml Nml Nml N/A  Right Ext Indicis Nml Nml Nml Nml Nml Nml Nml Nml Nml Nml Nml Nml N/A  Right PronatorTeres Nml Nml Nml Nml Nml Nml Nml Nml Nml Nml Nml Nml N/A  Right Biceps Nml Nml Nml Nml Nml Nml Nml Nml Nml Nml Nml Nml N/A  Right Triceps Nml Nml Nml Nml Nml Nml  Nml Nml Nml Nml Nml Nml N/A  Right Deltoid Nml Nml Nml Nml Nml Nml Nml Nml Nml Nml Nml Nml N/A  Left Gastroc Nml Nml Nml Nml Nml Nml Nml Nml Nml Nml Nml Nml N/A  Left Flex Dig Long Nml Nml Nml Nml Nml Nml Nml Nml Nml Nml Nml Nml N/A  Left RectFemoris Nml Nml Nml Nml Nml Nml Nml Nml Nml Nml Nml Nml N/A  Left  BicepsFemS Nml Nml Nml Nml Nml Nml Nml Nml Nml Nml Nml Nml N/A  Left GluteusMed Nml Nml Nml Nml Nml Nml Nml Nml Nml Nml Nml Nml N/A      Waveforms:

## 2015-08-18 ENCOUNTER — Ambulatory Visit: Payer: BLUE CROSS/BLUE SHIELD | Admitting: Neurology

## 2016-03-06 DIAGNOSIS — B309 Viral conjunctivitis, unspecified: Secondary | ICD-10-CM | POA: Diagnosis not present

## 2016-04-05 ENCOUNTER — Emergency Department (HOSPITAL_COMMUNITY): Payer: 59

## 2016-04-05 ENCOUNTER — Telehealth: Payer: Self-pay | Admitting: Cardiovascular Disease

## 2016-04-05 ENCOUNTER — Inpatient Hospital Stay (HOSPITAL_COMMUNITY)
Admission: EM | Admit: 2016-04-05 | Discharge: 2016-04-06 | DRG: 282 | Disposition: A | Discharge status: Home / Self Care | Payer: 59 | Attending: Cardiovascular Disease | Admitting: Cardiovascular Disease

## 2016-04-05 ENCOUNTER — Encounter (HOSPITAL_COMMUNITY): Payer: Self-pay | Admitting: *Deleted

## 2016-04-05 DIAGNOSIS — Z6828 Body mass index (BMI) 28.0-28.9, adult: Secondary | ICD-10-CM | POA: Diagnosis not present

## 2016-04-05 DIAGNOSIS — I214 Non-ST elevation (NSTEMI) myocardial infarction: Principal | ICD-10-CM | POA: Diagnosis present

## 2016-04-05 DIAGNOSIS — I25111 Atherosclerotic heart disease of native coronary artery with angina pectoris with documented spasm: Secondary | ICD-10-CM | POA: Diagnosis present

## 2016-04-05 DIAGNOSIS — I10 Essential (primary) hypertension: Secondary | ICD-10-CM | POA: Diagnosis present

## 2016-04-05 DIAGNOSIS — Z888 Allergy status to other drugs, medicaments and biological substances status: Secondary | ICD-10-CM

## 2016-04-05 DIAGNOSIS — Z79891 Long term (current) use of opiate analgesic: Secondary | ICD-10-CM

## 2016-04-05 DIAGNOSIS — Z881 Allergy status to other antibiotic agents status: Secondary | ICD-10-CM

## 2016-04-05 DIAGNOSIS — Z8249 Family history of ischemic heart disease and other diseases of the circulatory system: Secondary | ICD-10-CM

## 2016-04-05 DIAGNOSIS — Z79899 Other long term (current) drug therapy: Secondary | ICD-10-CM

## 2016-04-05 DIAGNOSIS — R55 Syncope and collapse: Secondary | ICD-10-CM | POA: Diagnosis present

## 2016-04-05 DIAGNOSIS — Z23 Encounter for immunization: Secondary | ICD-10-CM | POA: Diagnosis not present

## 2016-04-05 DIAGNOSIS — I771 Stricture of artery: Secondary | ICD-10-CM | POA: Diagnosis present

## 2016-04-05 DIAGNOSIS — E669 Obesity, unspecified: Secondary | ICD-10-CM | POA: Diagnosis present

## 2016-04-05 DIAGNOSIS — Z791 Long term (current) use of non-steroidal anti-inflammatories (NSAID): Secondary | ICD-10-CM

## 2016-04-05 DIAGNOSIS — Z87891 Personal history of nicotine dependence: Secondary | ICD-10-CM

## 2016-04-05 DIAGNOSIS — I252 Old myocardial infarction: Secondary | ICD-10-CM | POA: Diagnosis present

## 2016-04-05 DIAGNOSIS — I441 Atrioventricular block, second degree: Secondary | ICD-10-CM | POA: Diagnosis not present

## 2016-04-05 DIAGNOSIS — Z91013 Allergy to seafood: Secondary | ICD-10-CM | POA: Diagnosis not present

## 2016-04-05 DIAGNOSIS — Z833 Family history of diabetes mellitus: Secondary | ICD-10-CM | POA: Diagnosis not present

## 2016-04-05 DIAGNOSIS — I249 Acute ischemic heart disease, unspecified: Secondary | ICD-10-CM

## 2016-04-05 DIAGNOSIS — R911 Solitary pulmonary nodule: Secondary | ICD-10-CM | POA: Diagnosis not present

## 2016-04-05 DIAGNOSIS — Z91018 Allergy to other foods: Secondary | ICD-10-CM

## 2016-04-05 DIAGNOSIS — Z87442 Personal history of urinary calculi: Secondary | ICD-10-CM | POA: Diagnosis not present

## 2016-04-05 LAB — HEPATIC FUNCTION PANEL
ALBUMIN: 3.6 g/dL (ref 3.5–5.0)
ALT: 37 U/L (ref 17–63)
AST: 38 U/L (ref 15–41)
Alkaline Phosphatase: 92 U/L (ref 38–126)
Bilirubin, Direct: 0.2 mg/dL (ref 0.1–0.5)
Indirect Bilirubin: 1.1 mg/dL — ABNORMAL HIGH (ref 0.3–0.9)
TOTAL PROTEIN: 6.5 g/dL (ref 6.5–8.1)
Total Bilirubin: 1.3 mg/dL — ABNORMAL HIGH (ref 0.3–1.2)

## 2016-04-05 LAB — I-STAT TROPONIN, ED
Troponin i, poc: 0 ng/mL (ref 0.00–0.08)
Troponin i, poc: 0.14 ng/mL (ref 0.00–0.08)

## 2016-04-05 LAB — TSH: TSH: 1.628 u[IU]/mL (ref 0.350–4.500)

## 2016-04-05 LAB — CBC
HEMATOCRIT: 46.1 % (ref 39.0–52.0)
Hemoglobin: 16.3 g/dL (ref 13.0–17.0)
MCH: 30.7 pg (ref 26.0–34.0)
MCHC: 35.4 g/dL (ref 30.0–36.0)
MCV: 86.8 fL (ref 78.0–100.0)
PLATELETS: 281 10*3/uL (ref 150–400)
RBC: 5.31 MIL/uL (ref 4.22–5.81)
RDW: 12.4 % (ref 11.5–15.5)
WBC: 8.9 10*3/uL (ref 4.0–10.5)

## 2016-04-05 LAB — BASIC METABOLIC PANEL
Anion gap: 9 (ref 5–15)
BUN: 11 mg/dL (ref 6–20)
CHLORIDE: 106 mmol/L (ref 101–111)
CO2: 25 mmol/L (ref 22–32)
CREATININE: 0.91 mg/dL (ref 0.61–1.24)
Calcium: 9.4 mg/dL (ref 8.9–10.3)
GFR calc Af Amer: >60 mL/min (ref >60)
GFR calc non Af Amer: >60 mL/min (ref >60)
GLUCOSE: 118 mg/dL — AB (ref 65–99)
POTASSIUM: 3.7 mmol/L (ref 3.5–5.1)
Sodium: 140 mmol/L (ref 135–145)

## 2016-04-05 LAB — PROTIME-INR
INR: 0.93
Prothrombin Time: 12.5 seconds (ref 11.4–15.2)

## 2016-04-05 LAB — URINALYSIS, ROUTINE W REFLEX MICROSCOPIC
BILIRUBIN URINE: NEGATIVE
Glucose, UA: NEGATIVE mg/dL
HGB URINE DIPSTICK: NEGATIVE
Ketones, ur: NEGATIVE mg/dL
Leukocytes, UA: NEGATIVE
Nitrite: NEGATIVE
PH: 7 (ref 5.0–8.0)
Protein, ur: NEGATIVE mg/dL
SPECIFIC GRAVITY, URINE: 1.015 (ref 1.005–1.030)

## 2016-04-05 LAB — TROPONIN I
TROPONIN I: 0.32 ng/mL — AB (ref <0.03)
TROPONIN I: 0.89 ng/mL — AB (ref <0.03)
TROPONIN I: 1.8 ng/mL — AB (ref <0.03)

## 2016-04-05 LAB — HEPARIN LEVEL (UNFRACTIONATED): HEPARIN UNFRACTIONATED: 0.45 [IU]/mL (ref 0.30–0.70)

## 2016-04-05 LAB — T4, FREE: Free T4: 0.98 ng/dL (ref 0.61–1.12)

## 2016-04-05 LAB — MAGNESIUM: Magnesium: 2.1 mg/dL (ref 1.7–2.4)

## 2016-04-05 LAB — APTT: APTT: 30 s (ref 24–36)

## 2016-04-05 MED ORDER — ONDANSETRON HCL 4 MG/2ML IJ SOLN: 4.0000 mg | Freq: Four times a day (QID) | INTRAMUSCULAR | Status: DC | PRN

## 2016-04-05 MED ORDER — B COMPLEX-C PO TABS
1.0000 | ORAL_TABLET | Freq: Every day | ORAL | Status: DC
  Administered 2016-04-05 – 2016-04-06 (×2): 1 via ORAL
  Filled 2016-04-05 (×2): qty 1

## 2016-04-05 MED ORDER — GI COCKTAIL ~~LOC~~
30.0000 mL | Freq: Once | ORAL | Status: AC
  Administered 2016-04-05: 30 mL via ORAL
  Filled 2016-04-05: qty 30

## 2016-04-05 MED ORDER — SODIUM CHLORIDE 0.9 % IV SOLN: 250.0000 mL | INTRAVENOUS | Status: DC | PRN

## 2016-04-05 MED ORDER — ALPRAZOLAM 0.25 MG PO TABS: 0.2500 mg | ORAL_TABLET | Freq: Two times a day (BID) | ORAL | Status: DC | PRN

## 2016-04-05 MED ORDER — NITROGLYCERIN 2 % TD OINT
1.0000 [in_us] | TOPICAL_OINTMENT | Freq: Four times a day (QID) | TRANSDERMAL | Status: DC
  Filled 2016-04-05 (×5): qty 30

## 2016-04-05 MED ORDER — SODIUM CHLORIDE 0.9% FLUSH: 3.0000 mL | INTRAVENOUS | Status: DC | PRN

## 2016-04-05 MED ORDER — HEPARIN BOLUS VIA INFUSION
4000.0000 [IU] | Freq: Once | INTRAVENOUS | Status: AC
  Administered 2016-04-05: 4000 [IU] via INTRAVENOUS
  Filled 2016-04-05: qty 4000

## 2016-04-05 MED ORDER — B COMPLEX PO TABS: 1.0000 | ORAL_TABLET | Freq: Every day | ORAL | Status: DC

## 2016-04-05 MED ORDER — ASPIRIN EC 81 MG PO TBEC: 81.0000 mg | DELAYED_RELEASE_TABLET | Freq: Every day | ORAL | Status: DC

## 2016-04-05 MED ORDER — ATORVASTATIN CALCIUM 80 MG PO TABS
80.0000 mg | ORAL_TABLET | Freq: Every day | ORAL | Status: DC
  Administered 2016-04-05: 80 mg via ORAL
  Filled 2016-04-05: qty 1

## 2016-04-05 MED ORDER — ACETAMINOPHEN 325 MG PO TABS
650.0000 mg | ORAL_TABLET | ORAL | Status: DC | PRN
  Administered 2016-04-06: 650 mg via ORAL
  Filled 2016-04-05: qty 2

## 2016-04-05 MED ORDER — VITAMIN D 1000 UNITS PO TABS
2000.0000 [IU] | ORAL_TABLET | Freq: Every day | ORAL | Status: DC
  Administered 2016-04-05 – 2016-04-06 (×2): 2000 [IU] via ORAL
  Filled 2016-04-05 (×2): qty 2

## 2016-04-05 MED ORDER — ASPIRIN 81 MG PO CHEW
324.0000 mg | CHEWABLE_TABLET | ORAL | Status: AC
  Administered 2016-04-05: 324 mg via ORAL
  Filled 2016-04-05: qty 4

## 2016-04-05 MED ORDER — SODIUM CHLORIDE 0.9 % WEIGHT BASED INFUSION: 1.0000 mL/kg/h | INTRAVENOUS | Status: DC

## 2016-04-05 MED ORDER — HEPARIN (PORCINE) IN NACL 100-0.45 UNIT/ML-% IJ SOLN
1200.0000 [IU]/h | INTRAMUSCULAR | Status: DC
  Administered 2016-04-05: 1200 [IU]/h via INTRAVENOUS
  Filled 2016-04-05: qty 250

## 2016-04-05 MED ORDER — INFLUENZA VAC SPLIT QUAD 0.5 ML IM SUSY
0.5000 mL | PREFILLED_SYRINGE | INTRAMUSCULAR | Status: AC
  Administered 2016-04-06: 0.5 mL via INTRAMUSCULAR
  Filled 2016-04-05: qty 0.5

## 2016-04-05 MED ORDER — ASPIRIN 81 MG PO CHEW
81.0000 mg | CHEWABLE_TABLET | ORAL | Status: AC
  Administered 2016-04-06: 81 mg via ORAL
  Filled 2016-04-05: qty 1

## 2016-04-05 MED ORDER — DILTIAZEM HCL ER COATED BEADS 120 MG PO CP24
120.0000 mg | ORAL_CAPSULE | Freq: Every day | ORAL | Status: DC
  Administered 2016-04-05: 120 mg via ORAL
  Filled 2016-04-05 (×2): qty 1

## 2016-04-05 MED ORDER — NITROGLYCERIN 0.4 MG SL SUBL
0.4000 mg | SUBLINGUAL_TABLET | SUBLINGUAL | Status: DC | PRN
Start: 2016-04-05 — End: 2016-04-06

## 2016-04-05 MED ORDER — NITROGLYCERIN 0.4 MG SL SUBL
0.4000 mg | SUBLINGUAL_TABLET | Freq: Once | SUBLINGUAL | Status: AC
  Administered 2016-04-05: 0.4 mg via SUBLINGUAL

## 2016-04-05 MED ORDER — SODIUM CHLORIDE 0.9% FLUSH
3.0000 mL | Freq: Two times a day (BID) | INTRAVENOUS | Status: DC
  Administered 2016-04-05: 3 mL via INTRAVENOUS

## 2016-04-05 MED ORDER — ZOLPIDEM TARTRATE 5 MG PO TABS: 5.0000 mg | ORAL_TABLET | Freq: Every evening | ORAL | Status: DC | PRN

## 2016-04-05 MED ORDER — ASPIRIN 300 MG RE SUPP: 300.0000 mg | RECTAL | Status: AC

## 2016-04-05 MED ORDER — NITROGLYCERIN 0.4 MG SL SUBL
0.4000 mg | SUBLINGUAL_TABLET | Freq: Once | SUBLINGUAL | Status: AC
  Administered 2016-04-05: 0.4 mg via SUBLINGUAL
  Filled 2016-04-05: qty 1

## 2016-04-05 MED ORDER — SODIUM CHLORIDE 0.9 % WEIGHT BASED INFUSION
3.0000 mL/kg/h | INTRAVENOUS | Status: DC
  Administered 2016-04-06: 3 mL/kg/h via INTRAVENOUS

## 2016-04-05 MED ORDER — LOSARTAN POTASSIUM 50 MG PO TABS
50.0000 mg | ORAL_TABLET | Freq: Every day | ORAL | Status: DC
  Administered 2016-04-05: 50 mg via ORAL
  Filled 2016-04-05: qty 1

## 2016-04-05 NOTE — ED Notes (Signed)
Notified MD Burt Knack of elevated troponin.

## 2016-04-05 NOTE — Telephone Encounter (Signed)
New message   Pt c/o of Chest Pain: STAT if CP now or developed within 24 hours  1. Are you having CP right now? Yes, and both upper arms  2. Are you experiencing any other symptoms (ex. SOB, nausea, vomiting, sweating)? Pt states he feels off  3. How long have you been experiencing CP? 1 hour  4. Is your CP continuous or coming and going? Continuous   5. Have you taken Nitroglycerin? Took aspirin  ?

## 2016-04-05 NOTE — Progress Notes (Signed)
Trop I went up to 0.32, discussed with patient, he is chest pain free. I told him to let nursing staff know if chest pain recur. Otherwise, we plan for cardiac catheterization tomorrow. He is currently hemodynamically stable. NPO past midnight  Hilbert Corrigan PA Pager: [phone]

## 2016-04-05 NOTE — ED Triage Notes (Signed)
Pt reports onset today of bilateral upper arm pain and chest discomfort. Denies sob. ekg done at triage, no acute distress noted.
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
## 2016-04-05 NOTE — Telephone Encounter (Addendum)
Spoke w patient. He's reporting continuous chest pain, radiating to both arms, 3-4 out of 10 in severity which began approx 1 hr ago and has not improved. Also feels "off". Denies SOB, lightheadedness, jaw or neck discomfort. Pt informs me he thought of going to ER but wasn't sure if he should. I explained that active chest pain most appropriately should be handled in ER.  I recommended a prompt ER evaluation to which patient voiced understanding & agreement. Will go to [facility] for eval.

## 2016-04-05 NOTE — [place] Notes (Signed)
ANTICOAGULATION CONSULT NOTE - Follow-up Consult  Pharmacy Consult for Heparin Indication: chest pain/ACS  Allergies  Allergen Reactions  . Doxycycline Shortness Of Breath, Itching, Rash and Other (See Comments)    Itchy throat   . Mango Flavor Anaphylaxis  . Shrimp [Shellfish Allergy] Anaphylaxis and Other (See Comments)    Itching throat also  . Ace Inhibitors Cough  . Citrus Diarrhea    Oranges, in particular  . Lactose Intolerance (Gi) Diarrhea  . Omeprazole Nausea And Vomiting  . Pepcid [Famotidine] Nausea And Vomiting    Patient Measurements: Height: 5\' 11"  (180.3 cm) Weight: 209 lb 4.8 oz (94.9 kg) IBW/kg (Calculated) : 75.3 Heparin Dosing Weight: 95 kg  Vital Signs: Temp: 98.3 F (36.8 C) (03/07 1900) Temp Source: Oral (03/07 1900) BP: 147/102 (03/07 1900) Pulse Rate: 63 (03/07 1900)  Labs:  Recent Labs  04/05/16 1225 04/05/16 1626 04/05/16 1641 04/05/16 1754 04/05/16 2249  HGB 16.3  --   --   --   --   HCT 46.1  --   --   --   --   PLT 281  --   --   --   --   APTT  --  30  --   --   --   LABPROT  --   --  12.5  --   --   INR  --   --  0.93  --   --   HEPARINUNFRC  --   --   --   --  0.45  CREATININE 0.91  --   --   --   --   TROPONINI  --  0.32*  --  0.89*  --     Estimated Creatinine Clearance: 115.4 mL/min (by C-G formula based on SCr of 0.91 mg/dL).  Assessment: 51 y.o. M on heparin for NSTEMI. Heparin level therapeutic (0.45) on gtt at 1200 units/hr. Plan for cath tomorrow. No bleeding noted.  Goal of Therapy:  Heparin level 0.3-0.7 units/ml Monitor platelets by anticoagulation protocol: Yes   Plan:  Continue heparin 1200 units/hr Wil f/u am heparin level to confirm therapeutic  Sherlon Handing, PharmD, BCPS Clinical pharmacist, pager [phone] 04/05/2016,11:36 PM

## 2016-04-05 NOTE — Telephone Encounter (Signed)
[email or web address] 

## 2016-04-05 NOTE — [facility] Notes (Signed)
ANTICOAGULATION CONSULT NOTE - Initial Consult  Pharmacy Consult for Heparin Indication: chest pain/ACS  Allergies  Allergen Reactions  . Doxycycline Shortness Of Breath and Other (See Comments)    Itchy throat   . Mango Flavor Anaphylaxis  . Shrimp [Shellfish Allergy] Other (See Comments) and Anaphylaxis    Itching throat  . Citrus Diarrhea  . Omeprazole Nausea And Vomiting  . Pepcid [Famotidine] Nausea And Vomiting  . Prilosec [Omeprazole Magnesium] Other (See Comments)    Internal inflamation    Patient Measurements:   Heparin Dosing Weight: 90 kg (patient reported weight)  Vital Signs: Temp: 98.5 F (36.9 C) (03/07 1225) Temp Source: Oral (03/07 1225) BP: 164/97 (03/07 1400) Pulse Rate: 76 (03/07 1400)  Labs:  Recent Labs  04/05/16 1225  HGB 16.3  HCT 46.1  PLT 281  CREATININE 0.91    CrCl cannot be calculated (Unknown ideal weight.).   Medical History: Past Medical History:  Diagnosis Date  . Anxiety   . Family history of heart disease   . Gilbert's disease   . Headache    since age 45. Usually occur weekly  . History of kidney stones    2014,2016  . Hypertension   . Neuromuscular disorder ([place])   . Tinnitus     Medications:   (Not in a hospital admission) Scheduled:  . nitroGLYCERIN  0.4 mg Sublingual Once   Infusions:   PRN:  Anti-infectives    None      Assessment: Patient is a 51 yom who presented 3/7 with chest pain. Initial troponin wnl, with repeat of 0.14. No anticoagulation PTA per patient.   Baseline hemoglobin and platelet count are within normal limits. No bleeding noted.   Goal of Therapy:  Heparin level 0.3-0.7 units/ml Monitor platelets by anticoagulation protocol: Yes   Plan:  Give 4000 units bolus x 1 Start heparin infusion at 1200 units/hr Check anti-Xa level in 6 hours and daily while on heparin Continue to monitor H&H and platelets  Delane Ginger 04/05/2016,4:28 PM

## 2016-04-06 ENCOUNTER — Encounter (HOSPITAL_COMMUNITY)
Admission: EM | Disposition: A | Discharge status: Home / Self Care | Payer: Self-pay | Source: Home / Self Care | Attending: Cardiovascular Disease

## 2016-04-06 ENCOUNTER — Encounter (HOSPITAL_COMMUNITY): Payer: Self-pay | Admitting: Cardiology

## 2016-04-06 DIAGNOSIS — I249 Acute ischemic heart disease, unspecified: Secondary | ICD-10-CM

## 2016-04-06 HISTORY — PX: LEFT HEART CATH AND CORONARY ANGIOGRAPHY: CATH118249

## 2016-04-06 LAB — LIPID PANEL
CHOLESTEROL: 146 mg/dL (ref 0–200)
HDL: 33 mg/dL — ABNORMAL LOW (ref >40)
LDL Cholesterol: 73 mg/dL (ref 0–99)
TRIGLYCERIDES: 201 mg/dL — AB (ref <150)
Total CHOL/HDL Ratio: 4.4 RATIO
VLDL: 40 mg/dL (ref 0–40)

## 2016-04-06 LAB — HEMOGLOBIN A1C
Hgb A1c MFr Bld: 5.4 % (ref 4.8–5.6)
MEAN PLASMA GLUCOSE: 108 mg/dL

## 2016-04-06 LAB — CBC
HCT: 42.6 % (ref 39.0–52.0)
HEMATOCRIT: 43.6 % (ref 39.0–52.0)
Hemoglobin: 15.1 g/dL (ref 13.0–17.0)
Hemoglobin: 14.7 g/dL (ref 13.0–17.0)
MCH: 30.6 pg (ref 26.0–34.0)
MCH: 30.3 pg (ref 26.0–34.0)
MCHC: 34.6 g/dL (ref 30.0–36.0)
MCHC: 34.5 g/dL (ref 30.0–36.0)
MCV: 88.3 fL (ref 78.0–100.0)
MCV: 87.8 fL (ref 78.0–100.0)
Platelets: 257 10*3/uL (ref 150–400)
Platelets: 252 K/uL (ref 150–400)
RBC: 4.94 MIL/uL (ref 4.22–5.81)
RBC: 4.85 MIL/uL (ref 4.22–5.81)
RDW: 12.7 % (ref 11.5–15.5)
RDW: 12.7 % (ref 11.5–15.5)
WBC: 8.9 10*3/uL (ref 4.0–10.5)
WBC: 7.5 K/uL (ref 4.0–10.5)

## 2016-04-06 LAB — CREATININE, SERUM
Creatinine, Ser: 0.88 mg/dL (ref 0.61–1.24)
GFR calc Af Amer: >60 mL/min
GFR calc non Af Amer: >60 mL/min

## 2016-04-06 LAB — BASIC METABOLIC PANEL
Anion gap: 8 (ref 5–15)
BUN: 13 mg/dL (ref 6–20)
CHLORIDE: 106 mmol/L (ref 101–111)
CO2: 26 mmol/L (ref 22–32)
CREATININE: 0.98 mg/dL (ref 0.61–1.24)
Calcium: 8.6 mg/dL — ABNORMAL LOW (ref 8.9–10.3)
Glucose, Bld: 113 mg/dL — ABNORMAL HIGH (ref 65–99)
POTASSIUM: 3.6 mmol/L (ref 3.5–5.1)
SODIUM: 140 mmol/L (ref 135–145)

## 2016-04-06 LAB — HEPARIN LEVEL (UNFRACTIONATED): Heparin Unfractionated: 0.48 IU/mL (ref 0.30–0.70)

## 2016-04-06 LAB — TROPONIN I: TROPONIN I: 2.94 ng/mL — AB (ref <0.03)

## 2016-04-06 SURGERY — LEFT HEART CATH AND CORONARY ANGIOGRAPHY
Anesthesia: LOCAL

## 2016-04-06 MED ORDER — MIDAZOLAM HCL 2 MG/2ML IJ SOLN
INTRAMUSCULAR | Status: DC | PRN
  Administered 2016-04-06: 2 mg via INTRAVENOUS

## 2016-04-06 MED ORDER — DILTIAZEM HCL ER COATED BEADS 180 MG PO CP24
180.0000 mg | ORAL_CAPSULE | Freq: Every day | ORAL | Status: DC
  Administered 2016-04-06: 180 mg via ORAL
  Filled 2016-04-06: qty 1

## 2016-04-06 MED ORDER — HEPARIN (PORCINE) IN NACL 2-0.9 UNIT/ML-% IJ SOLN
INTRAMUSCULAR | Status: AC
  Filled 2016-04-06: qty 500

## 2016-04-06 MED ORDER — FENTANYL CITRATE (PF) 100 MCG/2ML IJ SOLN
INTRAMUSCULAR | Status: AC
  Filled 2016-04-06: qty 2

## 2016-04-06 MED ORDER — SODIUM CHLORIDE 0.9 % IV SOLN: 250.0000 mL | INTRAVENOUS | Status: DC | PRN

## 2016-04-06 MED ORDER — IOPAMIDOL (ISOVUE-370) INJECTION 76%
INTRAVENOUS | Status: DC | PRN
  Administered 2016-04-06: 75 mL via INTRA_ARTERIAL

## 2016-04-06 MED ORDER — HEPARIN SODIUM (PORCINE) 1000 UNIT/ML IJ SOLN
INTRAMUSCULAR | Status: DC | PRN
  Administered 2016-04-06: 5000 [IU] via INTRAVENOUS

## 2016-04-06 MED ORDER — NITROGLYCERIN 0.4 MG SL SUBL: 0.4000 mg | SUBLINGUAL_TABLET | SUBLINGUAL | 12 refills | Status: DC | PRN

## 2016-04-06 MED ORDER — HEPARIN (PORCINE) IN NACL 2-0.9 UNIT/ML-% IJ SOLN
INTRAMUSCULAR | Status: DC | PRN
  Administered 2016-04-06: 1000 mL

## 2016-04-06 MED ORDER — VERAPAMIL HCL 2.5 MG/ML IV SOLN
INTRAVENOUS | Status: AC
  Filled 2016-04-06: qty 2

## 2016-04-06 MED ORDER — MIDAZOLAM HCL 2 MG/2ML IJ SOLN
INTRAMUSCULAR | Status: AC
  Filled 2016-04-06: qty 2

## 2016-04-06 MED ORDER — SODIUM CHLORIDE 0.9% FLUSH: 3.0000 mL | INTRAVENOUS | Status: DC | PRN

## 2016-04-06 MED ORDER — DILTIAZEM HCL ER COATED BEADS 180 MG PO CP24: 180.0000 mg | ORAL_CAPSULE | Freq: Every day | ORAL | 1 refills | Status: DC

## 2016-04-06 MED ORDER — SODIUM CHLORIDE 0.9 % WEIGHT BASED INFUSION: 1.0000 mL/kg/h | INTRAVENOUS | Status: DC

## 2016-04-06 MED ORDER — FENTANYL CITRATE (PF) 100 MCG/2ML IJ SOLN
INTRAMUSCULAR | Status: DC | PRN
  Administered 2016-04-06: 25 ug via INTRAVENOUS

## 2016-04-06 MED ORDER — IOPAMIDOL (ISOVUE-370) INJECTION 76%
INTRAVENOUS | Status: AC
  Filled 2016-04-06: qty 100

## 2016-04-06 MED ORDER — LIDOCAINE HCL (PF) 1 % IJ SOLN
INTRAMUSCULAR | Status: AC
  Filled 2016-04-06: qty 30

## 2016-04-06 MED ORDER — HEPARIN (PORCINE) IN NACL 2-0.9 UNIT/ML-% IJ SOLN
INTRAMUSCULAR | Status: DC | PRN
  Administered 2016-04-06: 10 mL via INTRA_ARTERIAL

## 2016-04-06 MED ORDER — HEPARIN SODIUM (PORCINE) 5000 UNIT/ML IJ SOLN: 5000.0000 [IU] | Freq: Three times a day (TID) | INTRAMUSCULAR | Status: DC

## 2016-04-06 MED ORDER — LIDOCAINE HCL (PF) 1 % IJ SOLN
INTRAMUSCULAR | Status: DC | PRN
  Administered 2016-04-06: 2 mL

## 2016-04-06 MED ORDER — HEPARIN SODIUM (PORCINE) 1000 UNIT/ML IJ SOLN
INTRAMUSCULAR | Status: AC
  Filled 2016-04-06: qty 1

## 2016-04-06 MED ORDER — DILTIAZEM HCL ER COATED BEADS 120 MG PO CP24: 120.0000 mg | ORAL_CAPSULE | Freq: Every day | ORAL | 1 refills | Status: DC

## 2016-04-06 MED ORDER — SODIUM CHLORIDE 0.9% FLUSH: 3.0000 mL | Freq: Two times a day (BID) | INTRAVENOUS | Status: DC

## 2016-04-06 SURGICAL SUPPLY — 14 items
CATH EXPO 5FR ANG PIGTAIL 145 (CATHETERS) ×1 IMPLANT
CATH EXPO 5FR FR4 (CATHETERS) ×2 IMPLANT
CATH INFINITI 5 FR JL3.5 (CATHETERS) ×2 IMPLANT
CATH LAUNCHER 5F JL3 (CATHETERS) IMPLANT
CATHETER LAUNCHER 5F JL3 (CATHETERS) ×2
DEVICE RAD COMP TR BAND LRG (VASCULAR PRODUCTS) ×2 IMPLANT
GLIDESHEATH SLEND SS 6F .021 (SHEATH) ×1 IMPLANT
GUIDEWIRE INQWIRE 1.5J.035X260 (WIRE) IMPLANT
INQWIRE 1.5J .035X260CM (WIRE) ×2
KIT HEART LEFT (KITS) ×2 IMPLANT
PACK CARDIAC CATHETERIZATION (CUSTOM PROCEDURE TRAY) ×2 IMPLANT
SYR MEDRAD MARK V 150ML (SYRINGE) ×2 IMPLANT
TRANSDUCER W/STOPCOCK (MISCELLANEOUS) ×2 IMPLANT
TUBING CIL FLEX 10 FLL-RA (TUBING) ×2 IMPLANT

## 2016-04-06 NOTE — Discharge Summary (Signed)
Discharge Summary    Patient ID: Andrew Li,  MRN: [id number], DOB/AGE: May 18, 1965 51 y.o.  Admit date: 04/05/2016 Discharge date: 04/06/2016  Primary Care Provider: [name] Primary Cardiologist: Dr Sallyanne Kuster  Discharge Diagnoses    Principal Problem:   Acute coronary syndrome [facility]) Active Problems:   Accelerated hypertension   NSTEMI (non-ST elevated myocardial infarction) (HCC)   Allergies Allergies  Allergen Reactions  . Doxycycline Shortness Of Breath, Itching, Rash and Other (See Comments)    Itchy throat   . Mango Flavor Anaphylaxis  . Shrimp [Shellfish Allergy] Anaphylaxis and Other (See Comments)    Itching throat also  . Ace Inhibitors Cough  . Citrus Diarrhea    Oranges, in particular  . Lactose Intolerance (Gi) Diarrhea  . Omeprazole Nausea And Vomiting  . Pepcid [Famotidine] Nausea And Vomiting    Diagnostic Studies/Procedures    CATH: 04/06/2016  The left ventricular systolic function is normal.  LV end diastolic pressure is normal.  The left ventricular ejection fraction is 55-65% by visual estimate.  1. No significant obstructive disease. Mild ectasia in the proximal LAD and proximal to mid LCx. 2. Normal LV function 3. Normal LVEDP Plan: no clear reason seen for elevated troponin. LV function normal. No evidence of SCAD.  _____________   History of Present Illness     51 yo male w/ hx HTN, neurally mediated syncope and FH CAD, Holter in 2016 with SR w/ nl circadian variation, rare PVCs, single blocked P wave seen during sleep (likely an isolated second degree, Mobitz type I AV block event), physiological. Echo 2007 with normal EF and borderline concentric LVH.  2006 neg exercise myoview with EF 74%. Pt admitted 03/07 with chest pain.  Hospital Course     Consultants: None   Mr Holian's enzymes were cycled and were consistently elevated, NSTEMI. He was pain-free on ASA and heparin. He refused nitro paste, but had been given  SL NTG x 2. His BP was elevated, he was continued on his home doses on Cardizem and Cozaar, which he takes at night.   Lipid profile is below, his triglycerides were up and his HDL was a little low, but his LDL was 73. His A1c and TSH were normal.   He was taken to the cath lab on 03/08, results above. He had no significant CAD, no evidence of SCAD and no spasm was seen. However, his symptoms responded well to SL NTG, he will get this at discharge for possible spasm.  His BP was not well-controlled and may have contributed to his chest pain. His Cardizem was increased to 180 mg qd. He will follow up with his PCP for additional BP control. If he does not tolerate the increased Cardizem, can increase the Cozaar.  Post-cath, he was ambulating without chest pain or SOB. No further inpatient workup was indicated and he is considered stable for discharge, to follow up as an outpatient.  _____________  Discharge Vitals Blood pressure (!) 148/107, pulse (!) 64, temperature 98.1 F (36.7 C), temperature source Oral, resp. rate 12, height 5\' 11"  (1.803 m), weight 205 lb 1.6 oz (93 kg), SpO2 95 %.  Filed Weights   04/05/16 1544 04/05/16 1838 04/06/16 0500  Weight: 200 lb (90.7 kg) 209 lb 4.8 oz (94.9 kg) 205 lb 1.6 oz (93 kg)    Labs & Radiologic Studies    CBC  Recent Labs  04/06/16 0525 04/06/16 0842  WBC 8.9 7.5  HGB 15.1 14.7  HCT  43.6 42.6  MCV 88.3 87.8  PLT 257 637   Basic Metabolic Panel  Recent Labs  04/05/16 1225 04/05/16 1754 04/06/16 0525 04/06/16 0842  NA 140  --  140  --   K 3.7  --  3.6  --   CL 106  --  106  --   CO2 25  --  26  --   GLUCOSE 118*  --  113*  --   BUN 11  --  13  --   CREATININE 0.91  --  0.98 0.88  CALCIUM 9.4  --  8.6*  --   MG  --  2.1  --   --    Liver Function Tests  Recent Labs  04/05/16 2249  AST 38  ALT 37  ALKPHOS 92  BILITOT 1.3*  PROT 6.5  ALBUMIN 3.6   Cardiac Enzymes  Recent Labs  04/05/16 1754 04/05/16 2249  04/06/16 0525  TROPONINI 0.89* 1.80* 2.94*   Hemoglobin A1C  Recent Labs  04/05/16 1754  HGBA1C 5.4   Fasting Lipid Panel  Recent Labs  04/06/16 0525  CHOL 146  HDL 33*  LDLCALC 73  TRIG 201*  CHOLHDL 4.4   Thyroid Function Tests  Recent Labs  04/05/16 1754  TSH 1.628    _____________  Dg Chest 2 View  Result Date: 04/05/2016 CLINICAL DATA:  Chest and left arm pain EXAM: CHEST  2 VIEW COMPARISON:  01/14/2013 FINDINGS: Cardiac shadows within normal limits. The lungs are well aerated bilaterally. No focal infiltrate or sizable effusion is seen. Stable pulmonary nodule is again noted in the right upper lobe. No new focal abnormality is seen. IMPRESSION: Stable right upper lobe pulmonary nodule over multiple years. No acute abnormality noted. Electronically Signed   By: Inez Catalina M.D.   On: 04/05/2016 12:44   Disposition   Pt is being discharged home today in good condition.  Follow-up Plans & Appointments     Discharge Instructions    Call MD for:  redness, tenderness, or signs of infection (pain, swelling, redness, odor or green/yellow discharge around incision site)    Complete by:  As directed    Call MD for:  temperature >100.4    Complete by:  As directed    Diet - low sodium heart healthy    Complete by:  As directed    Increase activity slowly    Complete by:  As directed       Discharge Medications   Current Discharge Medication List    START taking these medications   Details  nitroGLYCERIN (NITROSTAT) 0.4 MG SL tablet Place 1 tablet (0.4 mg total) under the tongue every 5 (five) minutes x 3 doses as needed for chest pain. Qty: 25 tablet, Refills: 12      CONTINUE these medications which have CHANGED   Details  diltiazem (CARDIZEM CD) 180 MG 24 hr capsule Take 1 capsule (180 mg total) by mouth daily. Qty: 30 capsule, Refills: 1      CONTINUE these medications which have NOT CHANGED   Details  aspirin EC 325 MG tablet Take 325 mg by mouth  daily as needed for mild pain.    b complex vitamins tablet Take 1 tablet by mouth daily.    Cholecalciferol (VITAMIN D3) 2000 units TABS Take 2,000 Units by mouth daily.    Coenzyme Q10 (CO Q 10 PO) Take 1 capsule by mouth daily.    COPPER PO Take 1 tablet by mouth daily.  ibuprofen (ADVIL,MOTRIN) 200 MG tablet Take 600 mg by mouth every 6 (six) hours as needed (for pain).     losartan (COZAAR) 50 MG tablet Take 50 mg by mouth daily. Refills: 6    MAGNESIUM CITRATE PO Take 500 mg by mouth daily.     VITAMIN A PO Take 1,000 Units by mouth daily.    vitamin E 200 UNIT capsule Take 200 Units by mouth daily.         Aspirin prescribed at discharge?  Yes High Intensity Statin Prescribed? (Lipitor 40-80mg  or Crestor 20-40mg ): No: no CAD at cath Beta Blocker Prescribed? No: no CAD at cath For EF <40%, was ACEI/ARB Prescribed? n/a ADP Receptor Inhibitor Prescribed? (i.e. Plavix etc.-Includes Medically Managed Patients): n/a For EF <40%, Aldosterone Inhibitor Prescribed? n/a Was EF assessed during THIS hospitalization? Yes Was Cardiac Rehab II ordered? (Included Medically managed Patients): No: no CAD   Outstanding Labs/Studies   None  Duration of Discharge Encounter   Greater than 30 minutes including physician time.  Jonetta Speak NP 04/06/2016, 3:23 PM

## 2016-04-06 NOTE — Progress Notes (Signed)
Progress Note  Patient Name: Andrew Li Date of Encounter: 04/06/2016  Primary Cardiologist: Croitoru  Subjective   Patient is feeling fine. No chest pain or shortness of breath today. We discussed his cardiac catheterization results at length.  Inpatient Medications    Scheduled Meds: . aspirin EC  81 mg Oral Daily  . atorvastatin  80 mg Oral q1800  . B-complex with vitamin C  1 tablet Oral Daily  . cholecalciferol  2,000 Units Oral Daily  . diltiazem  180 mg Oral Daily  . heparin  5,000 Units Subcutaneous Q8H  . Influenza vac split quadrivalent PF  0.5 mL Intramuscular Tomorrow-1000  . losartan  50 mg Oral Q2000  . nitroGLYCERIN  1 inch Topical Q6H  . sodium chloride flush  3 mL Intravenous Q12H   Continuous Infusions: . sodium chloride 1 mL/kg/hr (04/06/16 0845)   PRN Meds: sodium chloride, acetaminophen, ALPRAZolam, nitroGLYCERIN, ondansetron (ZOFRAN) IV, sodium chloride flush, zolpidem   Vital Signs    Vitals:   04/06/16 0812 04/06/16 0827 04/06/16 0942 04/06/16 1012  BP: (!) 144/97 (!) 140/116 (!) 148/107 (!) 147/106  Pulse: (!) 0     Resp: 12     Temp:      TempSrc:      SpO2: (!) 0% 96% 95% 95%  Weight:      Height:        Intake/Output Summary (Last 24 hours) at 04/06/16 1316 Last data filed at 04/06/16 1301  Gross per 24 hour  Intake           1079.6 ml  Output              800 ml  Net            279.6 ml   Filed Weights   04/05/16 1544 04/05/16 1838 04/06/16 0500  Weight: 200 lb (90.7 kg) 209 lb 4.8 oz (94.9 kg) 205 lb 1.6 oz (93 kg)    Telemetry    Sinus rhythm - Personally Reviewed   Physical Exam  Pleasant, A&O GEN: No acute distress.   Neck: No JVD Cardiac: RRR, no murmurs, rubs, or gallops.  Respiratory: Clear to auscultation bilaterally. GI: Soft, nontender, non-distended  MS: No edema; No deformity. Right radial site with TR band in place Neuro:  Nonfocal  Psych: Normal affect   Labs    Chemistry Recent Labs Lab  04/05/16 1225 04/05/16 2249 04/06/16 0525 04/06/16 0842  NA 140  --  140  --   K 3.7  --  3.6  --   CL 106  --  106  --   CO2 25  --  26  --   GLUCOSE 118*  --  113*  --   BUN 11  --  13  --   CREATININE 0.91  --  0.98 0.88  CALCIUM 9.4  --  8.6*  --   PROT  --  6.5  --   --   ALBUMIN  --  3.6  --   --   AST  --  38  --   --   ALT  --  37  --   --   ALKPHOS  --  92  --   --   BILITOT  --  1.3*  --   --   GFRNONAA >60  --  >60 >60  GFRAA >60  --  >60 >60  ANIONGAP 9  --  8  --  Hematology Recent Labs Lab 04/05/16 1225 04/06/16 0525 04/06/16 0842  WBC 8.9 8.9 7.5  RBC 5.31 4.94 4.85  HGB 16.3 15.1 14.7  HCT 46.1 43.6 42.6  MCV 86.8 88.3 87.8  MCH 30.7 30.6 30.3  MCHC 35.4 34.6 34.5  RDW 12.4 12.7 12.7  PLT 281 257 252    Cardiac Enzymes Recent Labs Lab 04/05/16 1626 04/05/16 1754 04/05/16 2249 04/06/16 0525  TROPONINI 0.32* 0.89* 1.80* 2.94*    Recent Labs Lab 04/05/16 1240 04/05/16 1609  TROPIPOC 0.00 0.14*     BNPNo results for input(s): BNP, PROBNP in the last 168 hours.   DDimer No results for input(s): DDIMER in the last 168 hours.   Radiology    Dg Chest 2 View  Result Date: 04/05/2016 CLINICAL DATA:  Chest and left arm pain EXAM: CHEST  2 VIEW COMPARISON:  01/14/2013 FINDINGS: Cardiac shadows within normal limits. The lungs are well aerated bilaterally. No focal infiltrate or sizable effusion is seen. Stable pulmonary nodule is again noted in the right upper lobe. No new focal abnormality is seen. IMPRESSION: Stable right upper lobe pulmonary nodule over multiple years. No acute abnormality noted. Electronically Signed   By: Inez Catalina M.D.   On: 04/05/2016 12:44    Cardiac Studies   Cath findings reviewed  Patient Profile     51 y.o. male with chest pain syndrome consistent with non-ST elevation MI, troponin peak at 2.94 mg/dL  Assessment & Plan    1. Non-ST elevation MI: Cardiac catheter result reviewed and demonstrates only  minor nonobstructive CAD and normal LV function. Possibility of coronary vasospasm versus small vessel atheroembolic event discussed with the patient. He was not previously taking any antiplatelet therapy. The patient is reluctant to add medication such as clopidogrel or a statin drug. He declines addition of these medicines. Will treat with aspirin 81 mg daily. Lipids reviewed and LDL cholesterol is only 73.  2. Hypertension, uncontrolled: Patient with multiple medication intolerances. Will continue Cardizem CD 180 mg daily. Would increase losartan to 100 mg daily.  Dispo: home today  Signed, Sherren Mocha, MD  04/06/2016, 1:16 PM

## 2016-04-06 NOTE — Interval H&P Note (Signed)
History and Physical Interval Note:  04/06/2016 7:26 AM  Andrew Li  has presented today for surgery, with the diagnosis of nstemi  The various methods of treatment have been discussed with the patient and family. After consideration of risks, benefits and other options for treatment, the patient has consented to  Procedure(s): Left Heart Cath and Coronary Angiography (N/A) as a surgical intervention .  The patient's history has been reviewed, patient examined, no change in status, stable for surgery.  I have reviewed the patient's chart and labs.  Questions were answered to the patient's satisfaction.    Cath Lab Visit (complete for each Cath Lab visit)  Clinical Evaluation Leading to the Procedure:   ACS: Yes.    Non-ACS:    Anginal Classification: CCS IV  Anti-ischemic medical therapy: No Therapy  Non-Invasive Test Results: No non-invasive testing performed  Prior CABG: No previous CABG       [name] [facility] 04/06/2016 7:26 AM

## 2016-04-06 NOTE — Progress Notes (Signed)
Discharge instructions discussed re: post cath instruction and medications for discharge. Verbalized understanding. D/C'd to front entrance to take [place] home. Pt states that he has an adult to stay with him tonight. No further questions or concerns expressed.

## 2016-04-06 NOTE — Progress Notes (Signed)
Discharge orders received for discharge to home. Pt instructed to have an adult over 18 to stay with him overnight. Per pt, he states that he does not have anyone with whom he can stay with or that can stay with him over night. Lauretta Chester, PA notified.  Risks explained re: bleeding at arterial site s/p cardiac cath, and risk of injury and death if rt hand/wrist if bleeding occurs. Pt states that he understands and accepts all risks. Pt instructed not to drive motor vehicle or operate heavy machinery x 24 hours. Pt agrees to utilize [place] for transportation home. Pt instructed to ambulate in hallway. Verbalized understanding. No questions or concerns expressed.

## 2016-04-06 NOTE — Discharge Instructions (Signed)

## 2016-04-13 DIAGNOSIS — I10 Essential (primary) hypertension: Secondary | ICD-10-CM | POA: Diagnosis not present

## 2016-04-13 DIAGNOSIS — I214 Non-ST elevation (NSTEMI) myocardial infarction: Secondary | ICD-10-CM | POA: Diagnosis not present

## 2016-04-20 ENCOUNTER — Encounter: Payer: Self-pay | Admitting: Cardiology

## 2016-04-20 ENCOUNTER — Ambulatory Visit (INDEPENDENT_AMBULATORY_CARE_PROVIDER_SITE_OTHER): Payer: 59 | Admitting: Cardiology

## 2016-04-20 VITALS — BP 140/99 | HR 77 | Ht 71.0 in | Wt 210.0 lb

## 2016-04-20 DIAGNOSIS — I10 Essential (primary) hypertension: Secondary | ICD-10-CM | POA: Diagnosis not present

## 2016-04-20 DIAGNOSIS — Z8249 Family history of ischemic heart disease and other diseases of the circulatory system: Secondary | ICD-10-CM

## 2016-04-20 DIAGNOSIS — R072 Precordial pain: Secondary | ICD-10-CM

## 2016-04-20 DIAGNOSIS — Z0389 Encounter for observation for other suspected diseases and conditions ruled out: Secondary | ICD-10-CM

## 2016-04-20 DIAGNOSIS — R0609 Other forms of dyspnea: Secondary | ICD-10-CM | POA: Diagnosis not present

## 2016-04-20 DIAGNOSIS — IMO0001 Reserved for inherently not codable concepts without codable children: Secondary | ICD-10-CM

## 2016-04-20 DIAGNOSIS — I214 Non-ST elevation (NSTEMI) myocardial infarction: Secondary | ICD-10-CM | POA: Diagnosis not present

## 2016-04-20 DIAGNOSIS — R0600 Dyspnea, unspecified: Secondary | ICD-10-CM

## 2016-04-20 LAB — D-DIMER, QUANTITATIVE: D-Dimer, Quant: 0.21 mcg/mL FEU (ref <0.50)

## 2016-04-20 MED ORDER — ASPIRIN EC 81 MG PO TBEC: 81.0000 mg | DELAYED_RELEASE_TABLET | Freq: Every day | ORAL | 3 refills | Status: DC

## 2016-04-20 NOTE — Assessment & Plan Note (Signed)
Chest pain and Troponin peak-2.94-? Coronary spasm, ? Small thrombotic event

## 2016-04-20 NOTE — Assessment & Plan Note (Signed)
He is to start HCTZ

## 2016-04-20 NOTE — Patient Instructions (Signed)
Your physician recommends that you return for lab work TODAY.  Kerin Ransom, PA-C, recommends that you schedule a follow-up appointment in 3 months with Dr Burt Knack.  If you need a refill on your cardiac medications before your next appointment, please call your pharmacy.

## 2016-04-20 NOTE — Progress Notes (Signed)
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 He has been seen in the past by Dr Andrew Li. He has a hx of hypertension, neurally mediated syncope and family history of premature CAD (uncle). A Holter in 2016 showed SR with normal circadian variation, rare PVCs. An Echo in 2007 showed normal EF and borderline concentric LVH.  He had a negative exercise myoview in 2006.Marland Kitchen    He presented to the ED at [facility] 04/05/16 with chest pain, accelerated HTN, and an elevated Troponin. He was placed on Heparin and NTG. His Troponin peaked at 2.94. Cath done 04/06/16 demonstrated only minor nonobstructive CAD and normal LV function. Dr Andrew Li felt he possibly had coronary vasospasm versus a small vessel atheroembolic event. He was not previously taking any antiplatelet therapy. The patient was reluctant to add medication such as clopidogrel or a statin drug. He declined the addition of these medicines. The plan is to treat him with aspirin 81 mg daily. Lipids reviewed and LDL cholesterol is only 73. He was hypertensive and Dr Andrew Li recommended he continue his Diltiazem 180 mg daily and increase his Losartan to 100 mg daily.   The pt is in the office today for follow up. He has done well, no further chest pain. His PCP has added HCTZ though he hasn't picked it up yet. We discussed the possible causes of his recent admission. He related to me that he had a pleuritic component to his chest pain when he was admitted. He denies any recent surgery, injury, calf swelling, or long trips, no hemoptysis. He tells me he had an uncle who had multiple PE's after shoulder surgery last year.      Current Outpatient Prescriptions  Medication Sig Dispense Refill  . b complex vitamins tablet Take 1 tablet by mouth daily.    . Cholecalciferol (VITAMIN D3) 2000 units TABS Take 2,000  Units by mouth daily.    . Coenzyme Q10 (CO Q 10 PO) Take 60 mg by mouth daily.     . COPPER PO Take 1 tablet by mouth daily.     Marland Kitchen diltiazem (CARDIZEM CD) 180 MG 24 hr capsule Take 1 capsule (180 mg total) by mouth daily. 30 capsule 1  . ibuprofen (ADVIL,MOTRIN) 200 MG tablet Take 600 mg by mouth every 6 (six) hours as needed (for pain).     Marland Kitchen losartan (COZAAR) 50 MG tablet Take 50 mg by mouth daily.  6  . MAGNESIUM CITRATE PO Take 500 mg by mouth daily.     . nitroGLYCERIN (NITROSTAT) 0.4 MG SL tablet Place 1 tablet (0.4 mg total) under the tongue every 5 (five) minutes x 3 doses as needed for chest pain. 25 tablet 12  . VITAMIN A PO Take 4,000 Units by mouth daily.     . vitamin E 200 UNIT capsule Take 200 Units by mouth daily.    Marland Kitchen aspirin EC 81 MG tablet Take 1 tablet (81 mg total) by mouth daily. 90 tablet 3   No current facility-administered medications for this visit.     Allergies  Allergen Reactions  . Doxycycline Shortness Of Breath, Itching, Rash and Other (See Comments)    Itchy throat   . Mango Flavor Anaphylaxis  . Shrimp [Shellfish Allergy] Anaphylaxis and Other (See Comments)    Itching throat also  . Ace Inhibitors Cough  .  Citrus Diarrhea    Oranges, in particular  . Lactose Intolerance (Gi) Diarrhea  . Omeprazole Nausea And Vomiting  . Pepcid [Famotidine] Nausea And Vomiting    Past Medical History:  Diagnosis Date  . Anxiety   . Family history of heart disease   . Gilbert's disease   . Headache    since age 74. Usually occur weekly  . History of kidney stones    2014,2016  . Hypertension   . Neuromuscular disorder ([place])   . Tinnitus     Social History   Social History  . Marital status: Single    Spouse name: N/A  . Number of children: N/A  . Years of education: B.S.   Occupational History  . 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 Smoking status: Former Smoker     Years: 4.00    Quit date: 09/13/1994  . Smokeless tobacco: Never Used  . Alcohol use 0.0 oz/week  . Drug use: No  . Sexual activity: Not on file   Other Topics Concern  . Not on file   Social History Narrative   Lives alone in a 2 story home.  No children.  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  Education:      Family History  Problem Relation Age of Onset  . Hypertension Father 37  . Coronary artery disease Father   . Diabetes Father 24  . Hypertension Mother      Review of Systems: General: negative for chills, fever, night sweats or weight changes.  Cardiovascular: negative for chest pain, dyspnea on exertion, edema, orthopnea, palpitations, paroxysmal nocturnal dyspnea or shortness of breath Dermatological: negative for rash Respiratory: negative for cough or wheezing Urologic: negative for hematuria Abdominal: negative for nausea, vomiting, diarrhea, bright red blood per rectum, melena, or hematemesis Neurologic: negative for visual changes, syncope, or dizziness All other systems reviewed and are otherwise negative except as noted above.    Blood pressure (!) 140/99, pulse 77, height 5\' 11"  (1.803 m), weight 210 lb (95.3 kg), SpO2 99 %.  General appearance: alert, cooperative and no distress Neck: no carotid bruit and no JVD Lungs: clear to auscultation bilaterally Heart: regular rate and rhythm Extremities: extremities normal, atraumatic, no cyanosis or edema Skin: Skin color, texture, turgor normal. No rashes or lesions Neurologic: Grossly normal    ASSESSMENT AND PLAN:   NSTEMI (non-ST elevated myocardial infarction) (HCC) Chest pain and Troponin peak-2.94-? Coronary spasm, ? Small thrombotic event  Normal coronary arteries Normal coronaries by CT in 2012 and by cath 04/06/16  Accelerated hypertension He is to start HCTZ   PLAN  After talking with Andrew Li we decided it would be worthwhile to get a D-dimer. If this is elevated would purse LE  venous dopplers and a CTA . If negative f/u with dr Andrew Li in 3  Months. We discussed statin Rx- he has a history of Gilbert's syndrome and is not interested in taking statins.   Kerin Ransom PA-C 04/20/2016 3:12 PM

## 2016-04-20 NOTE — Assessment & Plan Note (Signed)
Normal coronaries by CT in 2012 and by cath 04/06/16

## 2016-04-24 ENCOUNTER — Telehealth: Payer: Self-pay | Admitting: Cardiology

## 2016-04-24 NOTE — Telephone Encounter (Signed)
New message  Pt call requesting to speak with RN about getting lab results. Please call back to discuss

## 2016-04-24 NOTE — Telephone Encounter (Signed)
Spoke to patient, advised D-Dimer from 3/23 looks OK (under threshold) - let me know if this requires any further follow up.

## 2016-06-20 DIAGNOSIS — L821 Other seborrheic keratosis: Secondary | ICD-10-CM | POA: Diagnosis not present

## 2016-06-20 DIAGNOSIS — D1801 Hemangioma of skin and subcutaneous tissue: Secondary | ICD-10-CM | POA: Diagnosis not present

## 2016-06-20 DIAGNOSIS — L815 Leukoderma, not elsewhere classified: Secondary | ICD-10-CM | POA: Diagnosis not present

## 2016-06-20 DIAGNOSIS — D485 Neoplasm of uncertain behavior of skin: Secondary | ICD-10-CM | POA: Diagnosis not present

## 2016-06-20 DIAGNOSIS — L814 Other melanin hyperpigmentation: Secondary | ICD-10-CM | POA: Diagnosis not present

## 2016-06-28 ENCOUNTER — Encounter: Payer: Self-pay | Admitting: Cardiovascular Disease

## 2016-07-19 ENCOUNTER — Encounter (INDEPENDENT_AMBULATORY_CARE_PROVIDER_SITE_OTHER): Payer: Self-pay

## 2016-07-19 ENCOUNTER — Ambulatory Visit (INDEPENDENT_AMBULATORY_CARE_PROVIDER_SITE_OTHER): Payer: 59 | Admitting: Cardiovascular Disease

## 2016-07-19 ENCOUNTER — Encounter: Payer: Self-pay | Admitting: Cardiovascular Disease

## 2016-07-19 VITALS — BP 160/94 | HR 80 | Ht 71.0 in | Wt 209.8 lb

## 2016-07-19 DIAGNOSIS — R0609 Other forms of dyspnea: Secondary | ICD-10-CM

## 2016-07-19 DIAGNOSIS — I10 Essential (primary) hypertension: Secondary | ICD-10-CM | POA: Diagnosis not present

## 2016-07-19 DIAGNOSIS — R0600 Dyspnea, unspecified: Secondary | ICD-10-CM

## 2016-07-19 DIAGNOSIS — R079 Chest pain, unspecified: Secondary | ICD-10-CM | POA: Diagnosis not present

## 2016-07-19 MED ORDER — LOSARTAN POTASSIUM 100 MG PO TABS: 100.0000 mg | ORAL_TABLET | Freq: Every day | ORAL | 3 refills | Status: DC

## 2016-07-19 NOTE — Patient Instructions (Addendum)
Your physician has recommended you make the following change in your medication:  1.)  Increase losartan to 100 mg daily  Your physician has requested that you have an echocardiogram. Echocardiography is a painless test that uses sound waves to create images of your heart. It provides your doctor with information about the size and shape of your heart and how well your heart's chambers and valves are working. This procedure takes approximately one hour. There are no restrictions for this procedure.  Your physician wants you to follow-up in: 6 months with Dr. Sallyanne Kuster.  You will receive a reminder letter in the mail two months in advance. If you don't receive a letter, please call our office to schedule the follow-up appointment.
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
## 2016-08-01 ENCOUNTER — Other Ambulatory Visit: Payer: Self-pay

## 2016-08-01 ENCOUNTER — Ambulatory Visit (HOSPITAL_COMMUNITY): Payer: 59 | Attending: Cardiovascular Disease

## 2016-08-01 DIAGNOSIS — R079 Chest pain, unspecified: Secondary | ICD-10-CM | POA: Diagnosis not present

## 2016-08-01 DIAGNOSIS — I5030 Unspecified diastolic (congestive) heart failure: Secondary | ICD-10-CM | POA: Insufficient documentation

## 2016-10-15 ENCOUNTER — Encounter: Payer: Self-pay | Admitting: Cardiovascular Disease

## 2016-10-28 IMAGING — CR DG ABDOMEN 1V
2 series · 2 of 2 positions shown · non-contrast
Comparison: 05/01/2014.

CLINICAL DATA: Preoperative radiograph. RIGHT-sided ureteral stone.

EXAM:
ABDOMEN - 1 VIEW

[t abdomen supine (1 of 2)]
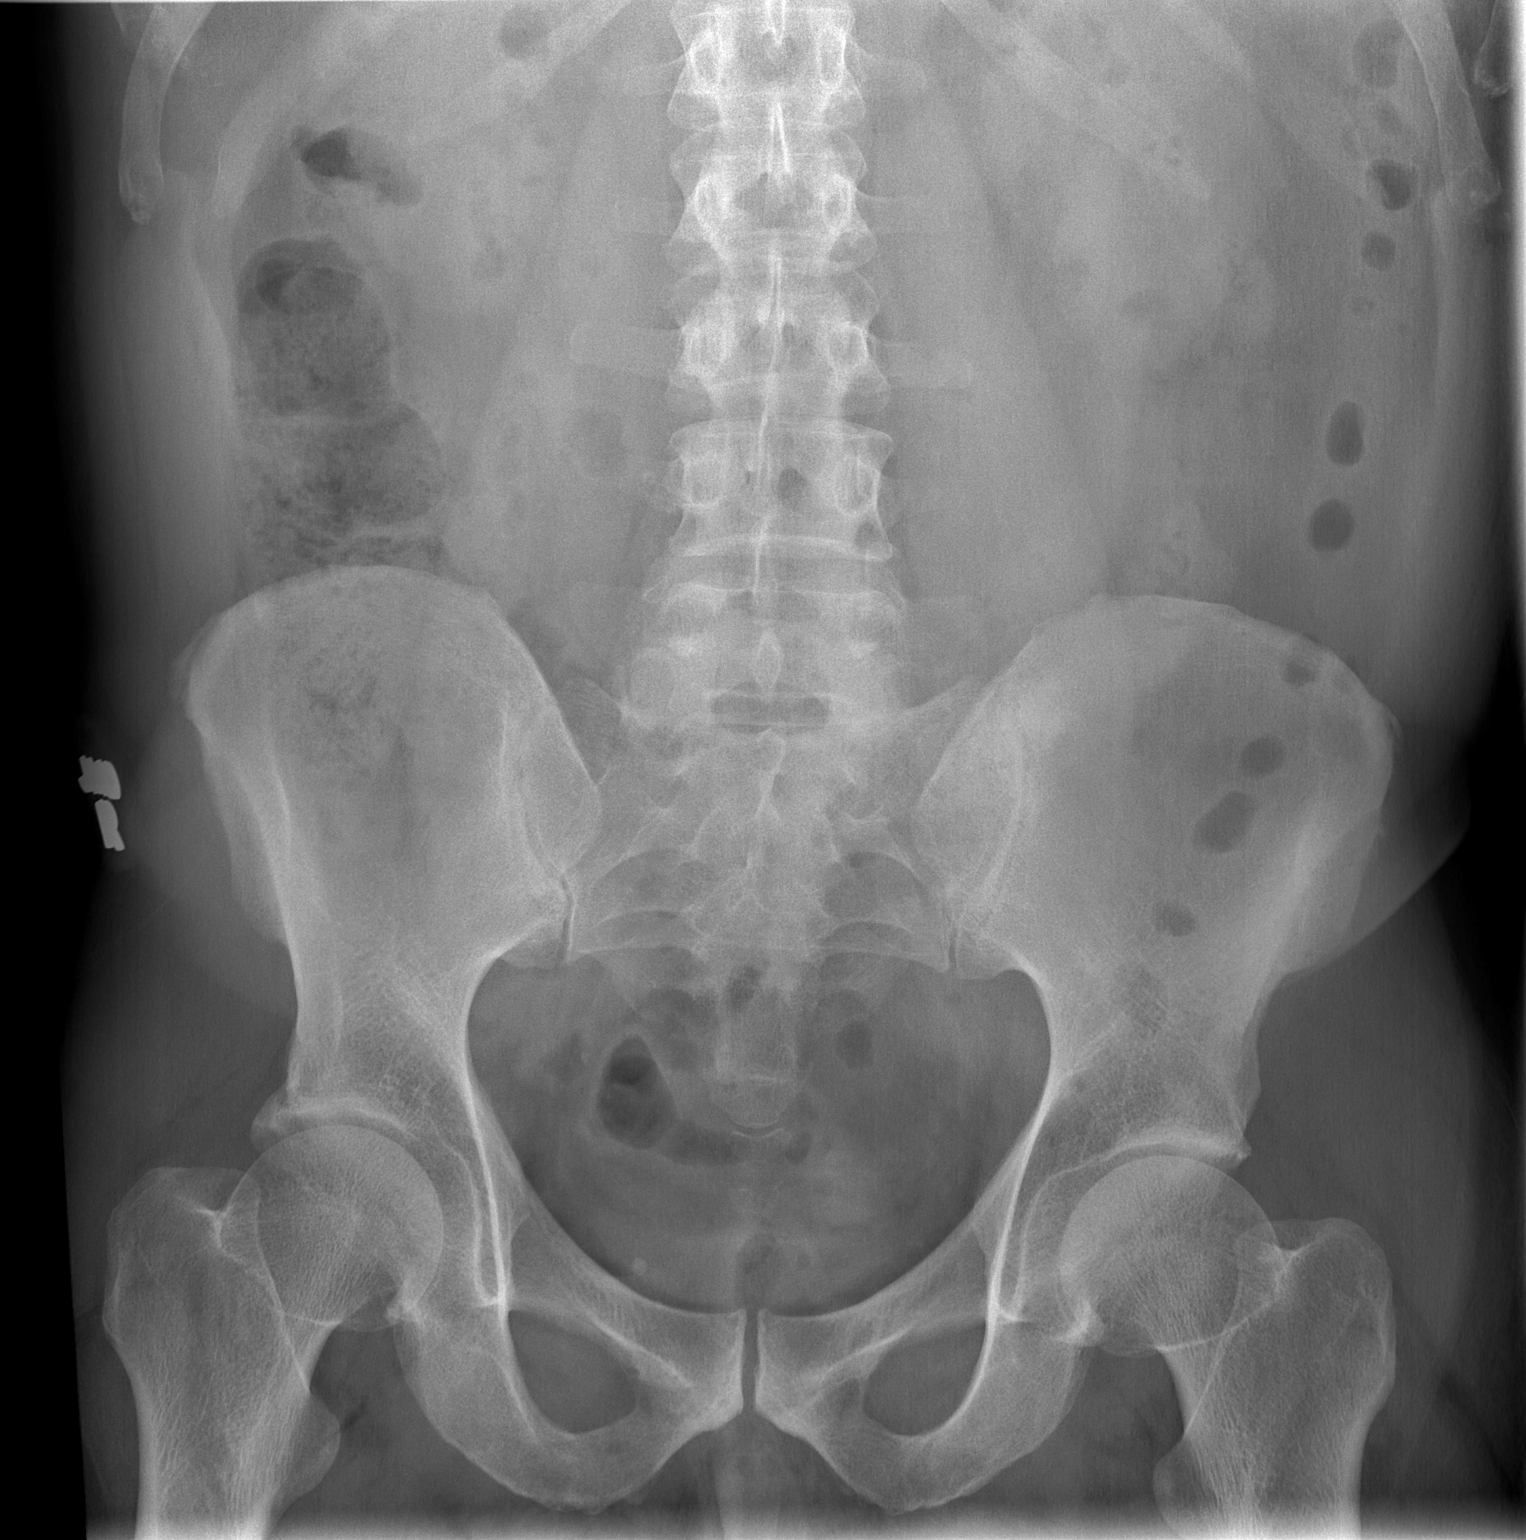

[t abdomen supine (2 of 2)]
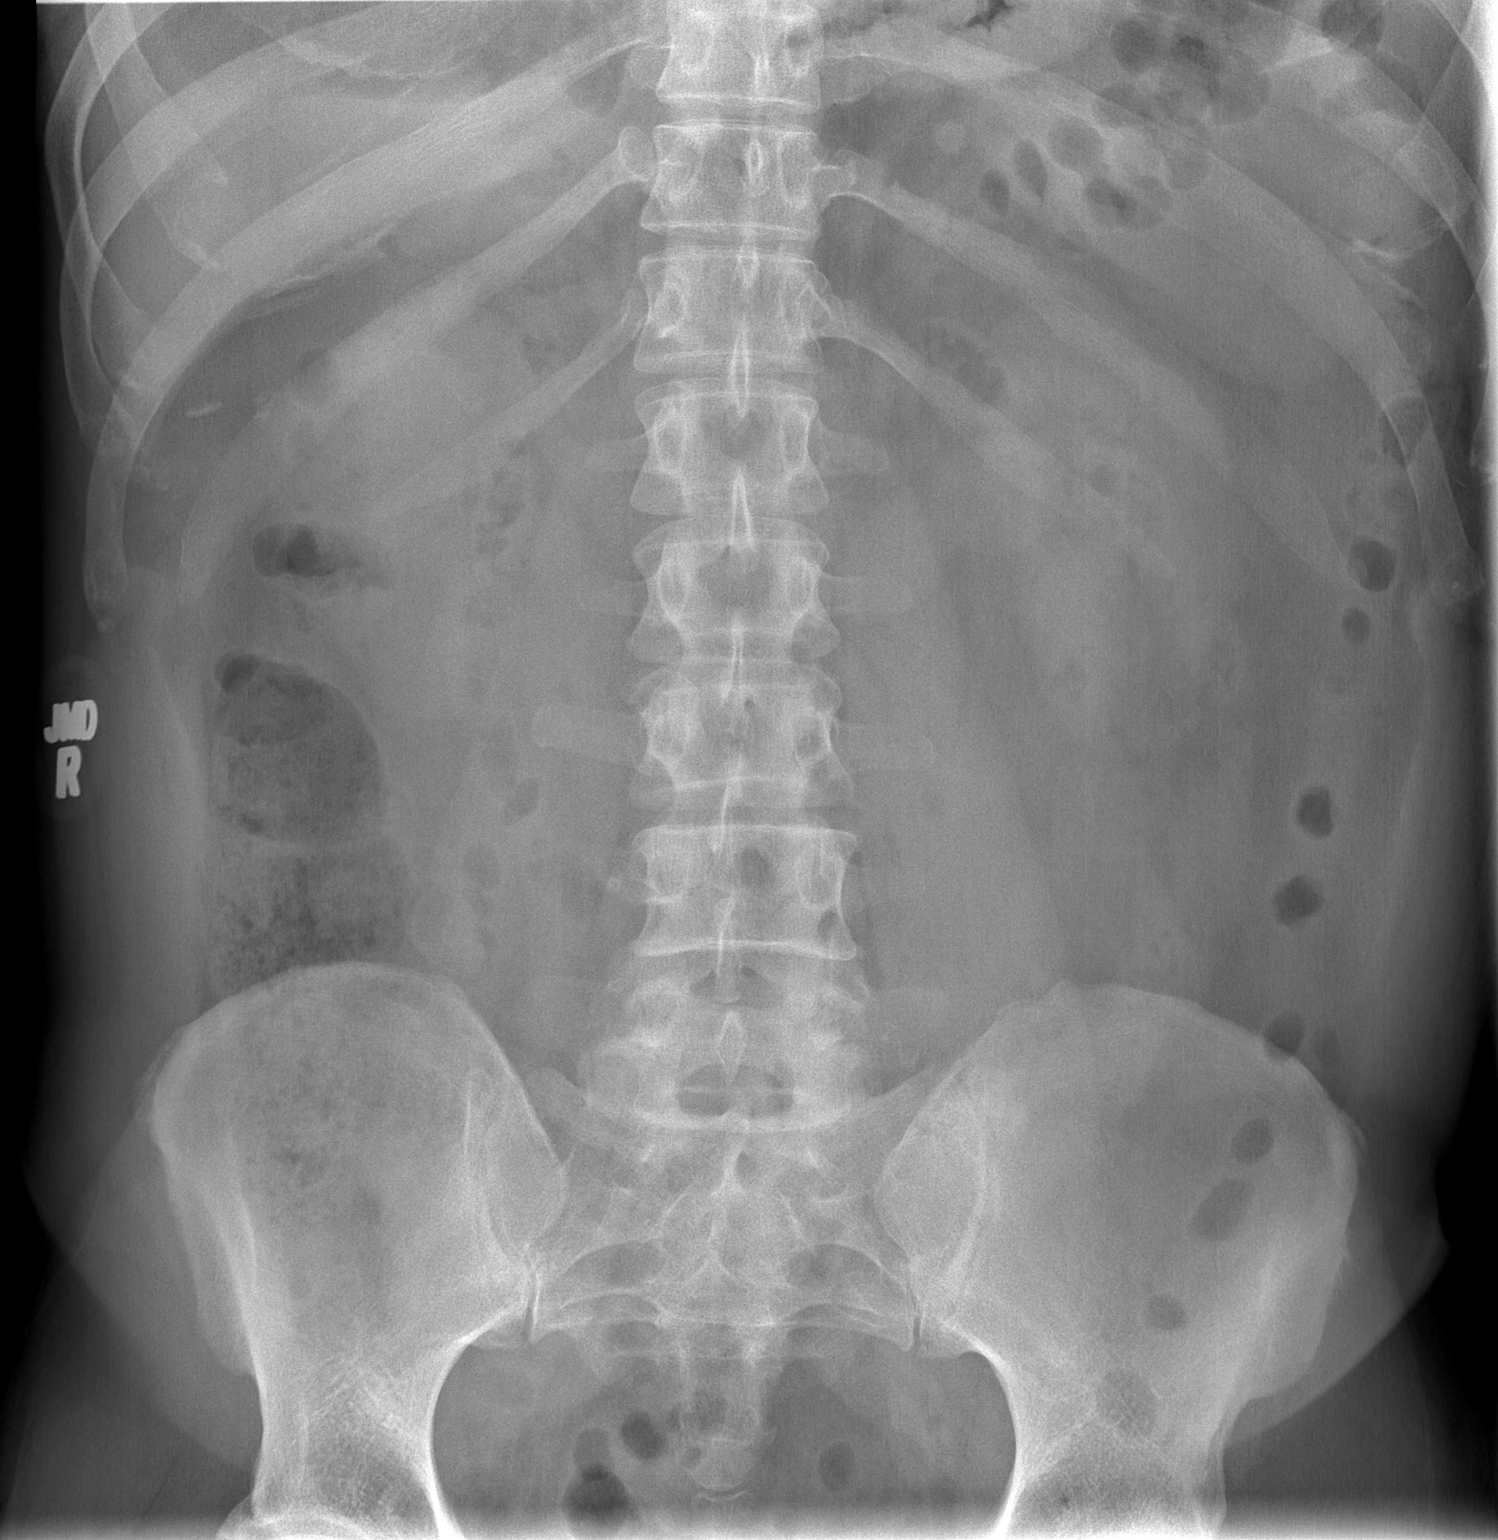

[2 of 2 positions shown; findings below may reference images not displayed]

FINDINGS: There is a punctate density adjacent to the RIGHT L4 spinous process
which could represent the previously seen ureteral calculus on
ultrasound. No other areas suspicious for LEFT ureteral calculus
identified. Phlebolith in the RIGHT anatomic pelvis is unchanged. No
large residual collecting system calculi are present.
IMPRESSION: Possible small (3 mm ) RIGHT ureteral calculus overlying the RIGHT
L4 transverse process.

## 2016-11-21 DIAGNOSIS — Z23 Encounter for immunization: Secondary | ICD-10-CM | POA: Diagnosis not present

## 2017-01-24 ENCOUNTER — Ambulatory Visit: Payer: 59 | Admitting: Cardiovascular Disease

## 2017-01-24 ENCOUNTER — Encounter: Payer: Self-pay | Admitting: Cardiovascular Disease

## 2017-01-24 VITALS — BP 130/98 | HR 80 | Ht 71.0 in | Wt 216.8 lb

## 2017-01-24 DIAGNOSIS — N200 Calculus of kidney: Secondary | ICD-10-CM

## 2017-01-24 DIAGNOSIS — I10 Essential (primary) hypertension: Secondary | ICD-10-CM

## 2017-01-24 DIAGNOSIS — R55 Syncope and collapse: Secondary | ICD-10-CM

## 2017-01-24 DIAGNOSIS — I214 Non-ST elevation (NSTEMI) myocardial infarction: Secondary | ICD-10-CM | POA: Diagnosis not present

## 2017-01-24 MED ORDER — DILTIAZEM HCL ER COATED BEADS 240 MG PO CP24: 240.0000 mg | ORAL_CAPSULE | Freq: Every day | ORAL | 3 refills | Status: DC
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
## 2017-01-24 NOTE — Patient Instructions (Signed)
Dr Sallyanne Kuster has recommended making the following medication changes: 1. INCREASE Diltiazem to 240 mg daily  Your physician has requested that you regularly monitor your blood pressure at home. Please use the same machine to check your blood pressure daily. Keep a record of your blood pressures using the log sheet provided. In 2 weeks, please report your readings back to Dr C. You may use our online patient portal 'MyChart' or you can call the office to speak with a nurse.  Dr Sallyanne Kuster recommends that you schedule a follow-up appointment in 6 months. You will receive a reminder letter in the mail two months in advance. If you don't receive a letter, please call our office to schedule the follow-up appointment.  If you need a refill on your cardiac medications before your next appointment, please call your pharmacy.

## 2017-01-25 DIAGNOSIS — N200 Calculus of kidney: Secondary | ICD-10-CM | POA: Insufficient documentation

## 2017-01-29 ENCOUNTER — Ambulatory Visit: Payer: 59 | Admitting: Family Medicine

## 2017-02-05 DIAGNOSIS — N400 Enlarged prostate without lower urinary tract symptoms: Secondary | ICD-10-CM | POA: Diagnosis not present

## 2017-02-05 DIAGNOSIS — Z87442 Personal history of urinary calculi: Secondary | ICD-10-CM | POA: Diagnosis not present

## 2017-02-15 ENCOUNTER — Ambulatory Visit: Payer: 59 | Admitting: Family Medicine

## 2017-02-15 ENCOUNTER — Encounter: Payer: Self-pay | Admitting: Family Medicine

## 2017-02-15 VITALS — BP 112/82 | HR 71 | Temp 98.4°F | Ht 71.25 in | Wt 225.0 lb

## 2017-02-15 DIAGNOSIS — Z87891 Personal history of nicotine dependence: Secondary | ICD-10-CM | POA: Diagnosis not present

## 2017-02-15 DIAGNOSIS — R945 Abnormal results of liver function studies: Secondary | ICD-10-CM

## 2017-02-15 DIAGNOSIS — I252 Old myocardial infarction: Secondary | ICD-10-CM

## 2017-02-15 DIAGNOSIS — N200 Calculus of kidney: Secondary | ICD-10-CM | POA: Diagnosis not present

## 2017-02-15 DIAGNOSIS — R918 Other nonspecific abnormal finding of lung field: Secondary | ICD-10-CM | POA: Insufficient documentation

## 2017-02-15 DIAGNOSIS — Z1211 Encounter for screening for malignant neoplasm of colon: Secondary | ICD-10-CM | POA: Diagnosis not present

## 2017-02-15 DIAGNOSIS — I10 Essential (primary) hypertension: Secondary | ICD-10-CM | POA: Diagnosis not present

## 2017-02-15 DIAGNOSIS — R7989 Other specified abnormal findings of blood chemistry: Secondary | ICD-10-CM | POA: Insufficient documentation

## 2017-02-15 NOTE — Assessment & Plan Note (Signed)
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  every few years he would have another cat scan and he does not want to do anymore due to cumulative radation A/P: we will get records. Patient aware of potential risks of not following anymore vs. Risks of continued radiation. Need to see reports but seems reasonable to d/c CT scans.

## 2017-02-15 NOTE — Assessment & Plan Note (Signed)
S: [facility]- Dr. Earlean Shawl years ago- liver biopsy - NASH. Lose weight. Was 260 at time. Had LFT elevation- dropped fructose and improved.  Lab Results  Component Value Date   ALT 37 04/05/2016   AST 38 04/05/2016   ALKPHOS 92 04/05/2016   BILITOT 1.3 (H) 04/05/2016  A/P: LFTs have normalized- slight bilirubin elevation due to gilberts. Glad patient has kept his weight down reasonably though he could lose some more weight

## 2017-02-15 NOTE — Assessment & Plan Note (Addendum)
Former smoker quit 1996. 0.6 pack years. Occasionally bummed cigarette since then He will need AAA screen at 79. Follows with urology for UAs
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
## 2017-02-15 NOTE — Assessment & Plan Note (Signed)
S:Was having symptoms of heart attack while [facility] back to 12/2010. Reports cardiac workup and had 0 calcium and negative stress test.   March 7th had symptom in left tricep then started having burning in central chest- goes to ER- heart cath the next day unrevealing- troponin levels up to 2.9 but no ekg changes= nstemi  A/P: Unclear cause- continues to follow with cardiology. Given reassuring catheterization patient has declined statin.

## 2017-02-15 NOTE — Patient Instructions (Addendum)
We will call you within a week or two about your referral to GI for colonoscopy. If you do not hear within 3 weeks, give [place] a call.   Sign release of information at the check out desk for all immunizations and 5 years of office notes.   Blood pressure better today but as you said it is variable- will monitor

## 2017-02-15 NOTE — Assessment & Plan Note (Signed)
S: controlled on diltiazem 240mg  XR, losartan 100mg  but he states varies greatly. Best with good nights sleep.  BP Readings from Last 3 Encounters:  02/15/17 112/82  01/24/17 (!) 130/98  07/19/16 (!) 160/94  A/P: We discussed blood pressure goal of <140/90. At goal today- Continue current meds

## 2017-02-22 ENCOUNTER — Encounter: Payer: Self-pay | Admitting: Family Medicine

## 2017-07-15 ENCOUNTER — Other Ambulatory Visit: Payer: Self-pay | Admitting: Cardiovascular Disease

## 2017-12-21 DIAGNOSIS — Z23 Encounter for immunization: Secondary | ICD-10-CM | POA: Diagnosis not present

## 2018-02-11 ENCOUNTER — Other Ambulatory Visit: Payer: Self-pay

## 2018-02-11 MED ORDER — DILTIAZEM HCL ER COATED BEADS 240 MG PO CP24: 240.0000 mg | ORAL_CAPSULE | Freq: Every day | ORAL | 0 refills | Status: DC

## 2018-03-11 ENCOUNTER — Other Ambulatory Visit: Payer: Self-pay | Admitting: Cardiovascular Disease

## 2018-04-10 ENCOUNTER — Other Ambulatory Visit: Payer: Self-pay | Admitting: Cardiovascular Disease

## 2018-04-16 ENCOUNTER — Other Ambulatory Visit: Payer: Self-pay | Admitting: Cardiovascular Disease

## 2018-05-01 ENCOUNTER — Encounter: Payer: Self-pay | Admitting: Family Medicine

## 2018-05-07 ENCOUNTER — Other Ambulatory Visit: Payer: Self-pay | Admitting: Cardiovascular Disease

## 2018-05-23 ENCOUNTER — Encounter: Payer: Self-pay | Admitting: Family Medicine

## 2018-05-23 ENCOUNTER — Ambulatory Visit (INDEPENDENT_AMBULATORY_CARE_PROVIDER_SITE_OTHER): Payer: 59 | Admitting: Family Medicine

## 2018-05-23 VITALS — BP 132/87 | HR 74 | Temp 97.5°F | Ht 71.0 in | Wt 209.0 lb

## 2018-05-23 DIAGNOSIS — E663 Overweight: Secondary | ICD-10-CM

## 2018-05-23 DIAGNOSIS — I10 Essential (primary) hypertension: Secondary | ICD-10-CM | POA: Diagnosis not present

## 2018-05-23 DIAGNOSIS — Z1211 Encounter for screening for malignant neoplasm of colon: Secondary | ICD-10-CM | POA: Diagnosis not present

## 2018-05-23 DIAGNOSIS — Z6829 Body mass index (BMI) 29.0-29.9, adult: Secondary | ICD-10-CM

## 2018-05-23 DIAGNOSIS — I252 Old myocardial infarction: Secondary | ICD-10-CM | POA: Diagnosis not present

## 2018-05-23 MED ORDER — TELMISARTAN 80 MG PO TABS
80.0000 mg | ORAL_TABLET | Freq: Every day | ORAL | 3 refills | Status: DC
Start: 2018-05-23 — End: 2018-08-22

## 2018-05-23 NOTE — Progress Notes (Signed)
Phone [phone]   Subjective:  Virtual visit via Video note. Chief complaint: Chief Complaint  Patient presents with  . Hypertension   This visit type was conducted due to national recommendations for restrictions regarding the COVID-19 Pandemic (e.g. social distancing).  This format is felt to be most appropriate for this patient at this time balancing risks to patient and risks to population by having him in for in person visit.  No physical exam was performed (except for noted visual exam or audio findings with Telehealth visits).    Our team/I attempted to connect with Andrew Li on 05/23/18 at  1:00 PM EDT by a video enabled telemedicine application ([email or web address]) and verified that I am speaking with the correct person using two identifiers.  -Interactive audio and video telecommunications were attempted between this provider and patient, however failed, due to patient having technical difficulties OR patient did not have access to video capability.  We continued and completed visit with audio only. Location patient: [facility] Location provider: [facility], office Persons participating in the virtual visit:  patient  Our team/I discussed the limitations of evaluation and management by telemedicine and the availability of in person appointments. In light of current covid-19 pandemic, patient also understands that we are trying to protect them by minimizing in office contact if at all possible.  The patient expressed consent for telemedicine visit and agreed to proceed. Patient understands insurance will be billed.   ROS- no fever/chills/cough. No chest pain or shortness of breath reported.    Past Medical History-  Patient Active Problem List   Diagnosis Date Noted  . Normal coronary arteries 04/20/2016    Priority: High  . History of non-ST elevation myocardial infarction (NSTEMI) 04/05/2016    Priority: High  . Diplopia 11/17/2014    Priority: High  . Vasovagal syncope  09/17/2013    Priority: High  . Essential hypertension 09/17/2013    Priority: Medium  . Elevated LFTs 02/15/2017    Priority: Low  . Former smoker 02/15/2017    Priority: Low  . Abnormal CT scan of lung 02/15/2017    Priority: Low  . Nephrolithiasis 01/25/2017    Priority: Low  . Ocular migraine 11/17/2014    Priority: Low    Medications- reviewed and updated Current Outpatient Medications  Medication Sig Dispense Refill  . b complex vitamins tablet Take 1 tablet by mouth daily.    . Cholecalciferol (VITAMIN D3) 2000 units TABS Take 2,000 Units by mouth daily.    . Coenzyme Q10 (CO Q 10 PO) Take 60 mg by mouth daily.     . COPPER PO Take 2 mg by mouth daily.     Marland Kitchen diltiazem (CARDIZEM CD) 240 MG 24 hr capsule TAKE 1 CAPSULE BY MOUTH DAILY. PLEASE MAKE OVERDUE ANNUAL APPT FOR FUTURE REFILLS. [phone]. 30 capsule 0  . ibuprofen (ADVIL,MOTRIN) 200 MG tablet Take 600 mg by mouth every 6 (six) hours as needed (for pain).     Marland Kitchen MAGNESIUM CITRATE PO Take 500 mg by mouth daily.     . nitroGLYCERIN (NITROSTAT) 0.4 MG SL tablet Place 1 tablet (0.4 mg total) under the tongue every 5 (five) minutes x 3 doses as needed for chest pain. 25 tablet 12  . telmisartan (MICARDIS) 80 MG tablet Take 1 tablet (80 mg total) by mouth daily. 90 tablet 3  . VITAMIN A PO Take 4,000 Units by mouth daily.     . vitamin E 200 UNIT capsule Take 200 Units by mouth  daily.     No current facility-administered medications for this visit.      Objective:  BP 132/87   Pulse 74   Temp (!) 97.5 F (36.4 C)   Ht 5\' 11"  (1.803 m)   Wt 209 lb (94.8 kg)   BMI 29.15 kg/m  nonlabored voice No obvious wheeze     Assessment and Plan   #hypertension S: controlled on losartan in the past. Ran out a month ago and home #s have varied- has had some up over 150. His pharmacy states they do not have any more losartan available BP Readings from Last 3 Encounters:  05/23/18 132/87  02/15/17 112/82  01/24/17 (!)  130/98  A/P: reasonable control on reading above but has had some #s above 150 off losartan-we opted to restart an ARB but will go with telmisartan as he reports that is available at his pharmacy-we will use 80 mg dose  # overweight S: got a rowing machine and is down about 16 lbs! Has tried to continue reasonably healthy diet- tries to get balance protein/carbs/fats. Doesn't do fastfood often. Does oatmeal everyday.  A/P: making great progress- Encouraged need for healthy eating, regular exercise, weight loss.    Other notes: 1.[facility] with [facility]- he is going to reach out just for a copy of the immunization since 2. No colonoscopy yet - try cologuard- ask kendra 3. still getting out and dancing and his knees will get tight - after that gets some edema for a day. He will monitor to make sure not worsening 4.  impacted ear wax left ear- has used debrox with some relief  Will plan on labs at physical in july Future Appointments  Date Time Provider [place]  08/22/2018  3:40 PM Marin Olp, MD [facility]   Lab/Order associations: Essential hypertension  Overweight  Screen for colon cancer - Plan: Cologuard  History of non-ST elevation myocardial infarction (NSTEMI)  BMI 29.0-29.9,adult  Meds ordered this encounter  Medications  . telmisartan (MICARDIS) 80 MG tablet    Sig: Take 1 tablet (80 mg total) by mouth daily.    Dispense:  90 tablet    Refill:  3    Return precautions advised.  [name], MD

## 2018-05-23 NOTE — Assessment & Plan Note (Signed)
May have bene vasospasm related given normal coronary arteries on cath 04/06/16. Even so- advised only sparing ibuprofen- he tries to keep this rare for his headahes

## 2018-05-23 NOTE — Patient Instructions (Addendum)
Health Maintenance Due  Topic Date Due  . TETANUS/TDAP - he will check with [facility] 06/21/1985  . COLONOSCOPY - cologuard will be sent 06/21/2016   Video visit

## 2018-05-24 NOTE — Progress Notes (Signed)
FYI  Order has been faxed to [facility], they will contact pt regarding order and shipping.

## 2018-06-09 DIAGNOSIS — Z1211 Encounter for screening for malignant neoplasm of colon: Secondary | ICD-10-CM | POA: Diagnosis not present

## 2018-06-09 LAB — COLOGUARD: Cologuard: NEGATIVE

## 2018-06-17 ENCOUNTER — Other Ambulatory Visit: Payer: Self-pay | Admitting: Cardiovascular Disease

## 2018-07-29 ENCOUNTER — Telehealth: Payer: Self-pay

## 2018-07-29 NOTE — Telephone Encounter (Signed)
In error

## 2018-08-13 ENCOUNTER — Telehealth: Payer: Self-pay | Admitting: *Deleted

## 2018-08-13 NOTE — Telephone Encounter (Signed)
Copied from [place] [id number]. Topic: General - Other >> Aug 12, 2018  5:21 PM Yvette Rack wrote: Reason for CRM: Pt stated he received a voicemail from a few weeks ago asking him to return the call regarding cologuard. Pt requests call back

## 2018-08-15 NOTE — Telephone Encounter (Signed)
Called and spoke with patient. He says that he completed the Cologuard the end of May early June and has not received results yet.   Will reach out to [facility]ard and call pt back.

## 2018-08-16 ENCOUNTER — Encounter: Payer: Self-pay | Admitting: Family Medicine

## 2018-08-16 NOTE — Telephone Encounter (Signed)
Looked up pt on [employer] portal. Per portal  " Provider Information    Name   Authorizing Provider Winfield Cunas, MD   -     Order Information   Priority Order Date Diagnosis   Routine May 24, 2018 Encounter for screening for malignant neoplasm of colon     Specimen Information   Type Source Specimen ID Collected Received Result Date   Stool Per Rectum [id number] 06/09/18 06/11/18 06/14/18     [facility] Status: Final result   Visible to patient: No (not released) Dx: Encounter for screening for malignant... Order: [id number]     Ref Range & Units 06/09/18 9:00 PM Test Result Not Applicable Negative"     Patient has been notified results abstracted   No further action needed!

## 2018-08-22 ENCOUNTER — Ambulatory Visit (INDEPENDENT_AMBULATORY_CARE_PROVIDER_SITE_OTHER): Payer: 59 | Admitting: Family Medicine

## 2018-08-22 ENCOUNTER — Encounter: Payer: Self-pay | Admitting: Family Medicine

## 2018-08-22 ENCOUNTER — Other Ambulatory Visit: Payer: Self-pay

## 2018-08-22 VITALS — BP 133/88 | HR 80 | Temp 98.4°F | Ht 69.5 in | Wt 222.2 lb

## 2018-08-22 DIAGNOSIS — Z23 Encounter for immunization: Secondary | ICD-10-CM

## 2018-08-22 DIAGNOSIS — E785 Hyperlipidemia, unspecified: Secondary | ICD-10-CM | POA: Diagnosis not present

## 2018-08-22 DIAGNOSIS — Z0000 Encounter for general adult medical examination without abnormal findings: Secondary | ICD-10-CM | POA: Diagnosis not present

## 2018-08-22 LAB — POC URINALSYSI DIPSTICK (AUTOMATED)
Bilirubin, UA: NEGATIVE
Blood, UA: NEGATIVE
Glucose, UA: NEGATIVE
Ketones, UA: NEGATIVE
Nitrite, UA: NEGATIVE
Protein, UA: NEGATIVE
Spec Grav, UA: 1.025 (ref 1.010–1.025)
Urobilinogen, UA: 1 E.U./dL
pH, UA: 6 (ref 5.0–8.0)

## 2018-08-22 MED ORDER — TELMISARTAN 80 MG PO TABS: 80.0000 mg | ORAL_TABLET | Freq: Every day | ORAL | 3 refills | Status: DC

## 2018-08-22 MED ORDER — NITROGLYCERIN 0.4 MG SL SUBL: 0.4000 mg | SUBLINGUAL_TABLET | SUBLINGUAL | 12 refills | Status: AC | PRN

## 2018-08-22 MED ORDER — DILTIAZEM HCL ER COATED BEADS 240 MG PO CP24: 240.0000 mg | ORAL_CAPSULE | Freq: Every day | ORAL | 3 refills | Status: DC

## 2018-08-22 NOTE — Patient Instructions (Addendum)
Health Maintenance Due  Topic Date Due  . TETANUS/TDAP Done today  Hopeful shingrix next year 06/21/1985  . COLONOSCOPY - Colog[facility]ard 06/09/18- good for 3 years 06/21/2016   Would advise updated optho visit  Try to avoid sudafed if possible as we see what it does to your BP  6 month follow up as long as home BPs < 140/90 on average  Consider follow up with [facility] [place]- that's a long time for the back to be a problem
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
## 2018-08-23 ENCOUNTER — Other Ambulatory Visit: Payer: Self-pay

## 2018-08-23 DIAGNOSIS — R8290 Unspecified abnormal findings in urine: Secondary | ICD-10-CM

## 2018-08-23 LAB — LIPID PANEL
Cholesterol: 173 mg/dL (ref 0–200)
HDL: 36.7 mg/dL — ABNORMAL LOW (ref >39.00)
LDL Cholesterol: 104 mg/dL — ABNORMAL HIGH (ref 0–99)
NonHDL: 135.95
Total CHOL/HDL Ratio: 5
Triglycerides: 162 mg/dL — ABNORMAL HIGH (ref 0.0–149.0)
VLDL: 32.4 mg/dL (ref 0.0–40.0)

## 2018-08-23 LAB — CBC
HCT: 49 % (ref 39.0–52.0)
Hemoglobin: 16.5 g/dL (ref 13.0–17.0)
MCHC: 33.7 g/dL (ref 30.0–36.0)
MCV: 91.7 fl (ref 78.0–100.0)
Platelets: 293 10*3/uL (ref 150.0–400.0)
RBC: 5.35 Mil/uL (ref 4.22–5.81)
RDW: 13.3 % (ref 11.5–15.5)
WBC: 8.8 10*3/uL (ref 4.0–10.5)

## 2018-08-23 LAB — COMPREHENSIVE METABOLIC PANEL
ALT: 34 U/L (ref 0–53)
AST: 26 U/L (ref 0–37)
Albumin: 4.3 g/dL (ref 3.5–5.2)
Alkaline Phosphatase: 143 U/L — ABNORMAL HIGH (ref 39–117)
BUN: 14 mg/dL (ref 6–23)
CO2: 24 mEq/L (ref 19–32)
Calcium: 9.3 mg/dL (ref 8.4–10.5)
Chloride: 105 mEq/L (ref 96–112)
Creatinine, Ser: 1.06 mg/dL (ref 0.40–1.50)
GFR: 73.32 mL/min (ref >60.00)
Glucose, Bld: 100 mg/dL — ABNORMAL HIGH (ref 70–99)
Potassium: 3.7 mEq/L (ref 3.5–5.1)
Sodium: 140 mEq/L (ref 135–145)
Total Bilirubin: 0.8 mg/dL (ref 0.2–1.2)
Total Protein: 7 g/dL (ref 6.0–8.3)

## 2019-02-20 ENCOUNTER — Encounter: Payer: Self-pay | Admitting: Family Medicine

## 2019-02-20 ENCOUNTER — Ambulatory Visit (INDEPENDENT_AMBULATORY_CARE_PROVIDER_SITE_OTHER): Payer: 59 | Admitting: Family Medicine

## 2019-02-20 ENCOUNTER — Other Ambulatory Visit: Payer: Self-pay

## 2019-02-20 VITALS — BP 138/98 | HR 74 | Temp 97.5°F | Ht 69.5 in | Wt 215.8 lb

## 2019-02-20 DIAGNOSIS — E1169 Type 2 diabetes mellitus with other specified complication: Secondary | ICD-10-CM

## 2019-02-20 DIAGNOSIS — E785 Hyperlipidemia, unspecified: Secondary | ICD-10-CM

## 2019-02-20 DIAGNOSIS — I10 Essential (primary) hypertension: Secondary | ICD-10-CM

## 2019-02-20 DIAGNOSIS — E669 Obesity, unspecified: Secondary | ICD-10-CM

## 2019-02-20 MED ORDER — NEBIVOLOL HCL 2.5 MG PO TABS: 2.5000 mg | ORAL_TABLET | Freq: Every day | ORAL | 5 refills | Status: DC

## 2019-02-20 NOTE — Progress Notes (Signed)
Phone [phone] Virtual visit via Video note   Subjective:  Chief complaint: Chief Complaint  Patient presents with  . Follow-up    This visit type was conducted due to national recommendations for restrictions regarding the COVID-19 Pandemic (e.g. social distancing).  This format is felt to be most appropriate for this patient at this time balancing risks to patient and risks to population by having him in for in person visit.  No physical exam was performed (except for noted visual exam or audio findings with Telehealth visits).    Our team/I connected with Andrew Li at  3:40 PM EST by a video enabled telemedicine application (doxy.me or caregility through epic) and verified that I am speaking with the correct person using two identifiers.  Location patient: Home[place] Location provider: [facility], office Persons participating in the virtual visit:  patient  Our team/I discussed the limitations of evaluation and management by telemedicine and the availability of in person appointments. In light of current covid-19 pandemic, patient also understands that we are trying to protect them by minimizing in office contact if at all possible.  The patient expressed consent for telemedicine visit and agreed to proceed. Patient understands insurance will be billed.   Past Medical History-  Patient Active Problem List   Diagnosis Date Noted  . Normal coronary arteries 04/20/2016    Priority: High  . History of non-ST elevation myocardial infarction (NSTEMI) 04/05/2016    Priority: High  . Diplopia 11/17/2014    Priority: High  . Vasovagal syncope 09/17/2013    Priority: High  . Hyperlipidemia associated with type 2 diabetes mellitus ([place]) 02/20/2019    Priority: Medium  . Essential hypertension 09/17/2013    Priority: Medium  . Elevated LFTs 02/15/2017    Priority: Low  . Former smoker 02/15/2017    Priority: Low  . Abnormal CT scan of lung 02/15/2017    Priority: Low  .  Nephrolithiasis 01/25/2017    Priority: Low  . Ocular migraine 11/17/2014    Priority: Low    Medications- reviewed and updated Current Outpatient Medications  Medication Sig Dispense Refill  . b complex vitamins tablet Take 1 tablet by mouth daily.    . Cholecalciferol (VITAMIN D3) 2000 units TABS Take 2,000 Units by mouth daily.    . Citrulline 1 g PACK Take by mouth.    . Coenzyme Q10 (CO Q 10 PO) Take 60 mg by mouth daily.     . COPPER PO Take 2 mg by mouth daily.     Marland Kitchen diltiazem (CARDIZEM CD) 240 MG 24 hr capsule Take 1 capsule (240 mg total) by mouth daily. 90 capsule 3  . ibuprofen (ADVIL,MOTRIN) 200 MG tablet Take 600 mg by mouth every 6 (six) hours as needed (for pain).     Marland Kitchen MAGNESIUM CITRATE PO Take 500 mg by mouth daily.     . nitroGLYCERIN (NITROSTAT) 0.4 MG SL tablet Place 1 tablet (0.4 mg total) under the tongue every 5 (five) minutes x 3 doses as needed for chest pain. 25 tablet 12  . telmisartan (MICARDIS) 80 MG tablet Take 1 tablet (80 mg total) by mouth daily. 90 tablet 3  . VITAMIN A PO Take 4,000 Units by mouth daily.     . vitamin C (ASCORBIC ACID) 500 MG tablet Take 500 mg by mouth daily.    . vitamin E 200 UNIT capsule Take 200 Units by mouth daily.    Marland Kitchen VITAMIN K PO Take 600 mg by mouth.    Marland Kitchen  nebivolol (BYSTOLIC) 2.5 MG tablet Take 1 tablet (2.5 mg total) by mouth daily. 30 tablet 5   No current facility-administered medications for this visit.     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 Everyone wears masks at work. He doesn't have a lot of direct contact with  her.   # had dislocated collarbone but chiropractor helped him. Also had some radiating pain into arm- slight cervical radiculopathy which improved thing.   # Hypertension S:Taking Diltiazem 240 mg and Telmisartan 80 mg daily.Home readings have been running high recently.   Feels like doing good on low salt diet. Exercise has been down lately. Was worried about collarbone and neck and was hesitant. He thinks he could get back into biking.   Does think he drinks too much coffee. Stress levels also high and wonders if that contributed.  BP Readings from Last 3 Encounters:  02/20/19 (!) 138/98  08/22/18 133/88  05/23/18 132/87  A/P: Poor control-patient is going to try to cut back on caffeine and increase exercise and work on weight loss.  Patient does have a history of NSTEMI with unclear cause-possibly associated with hypertension or vasospasm.  We opted to add Bystolic 2.5 mg to try to be more aggressive.  26-month follow-up at the latest-sooner if blood pressure is not improving  #Dry blood in the ear S:Patient woke up about three weeks ago with some dried blood in left ear- never stuck anything in the ear- other than ear plugs. Marland Kitchen He did not have any issues with hearing loss, pain or pressure.  A/P: No obvious red flags-if patient has recurrent issues he will certainly let us know-specifically hearing loss, pain or pressure.  I doubt tympanic membrane rupture with no change in hearing  # Obesity  S:down 7 lbs from July- had some prior weight gain with covid. Wants to get down to 190.    Has used myfitness pal in past- 2000 calories helped him maintain at 190.  Wt Readings from Last 3 Encounters:  02/20/19 215 lb 12.8 oz (97.9 kg)  08/22/18 222 lb 3.2 oz (100.8 kg)  05/23/18 209 lb (94.8 kg)  A/P: Slight improvement from last visit-Encouraged need for healthy eating, regular exercise, weight loss.    Hyperlipidemia associated with type 2 diabetes mellitus ([place]) S: Patient with history  of MI but normal coronary arteries-he has declined statin here and at cardiology Lab Results  Component Value Date   CHOL 173 08/22/2018   HDL 36.70 (L) 08/22/2018   LDLCALC 104 (H) 08/22/2018   TRIG 162.0 (H) 08/22/2018   CHOLHDL 5 08/22/2018  A/P: Mild elevations in lipids and even with clean coronaries would consider LDL goal under 70-we will recheck at physical in 6 months and reassess   Recommended follow up: 35-month follow-up recommended  Lab/Order associations:   ICD-10-CM   1. Essential hypertension  I10   2. Hyperlipidemia associated with type 2 diabetes mellitus ([place])  E11.69    E78.5   3. Obesity (BMI  30-39.9)  E66.9     Meds ordered this encounter  Medications  . nebivolol (BYSTOLIC) 2.5 MG tablet    Sig: Take 1 tablet (2.5 mg total) by mouth daily.    Dispense:  30 tablet    Refill:  5   Return precautions advised.  [name], MD

## 2019-02-20 NOTE — Assessment & Plan Note (Signed)
S: Patient with history of MI but normal coronary arteries-he has declined statin here and at cardiology Lab Results  Component Value Date   CHOL 173 08/22/2018   HDL 36.70 (L) 08/22/2018   LDLCALC 104 (H) 08/22/2018   TRIG 162.0 (H) 08/22/2018   CHOLHDL 5 08/22/2018  A/P: Mild elevations in lipids and even with clean coronaries would consider LDL goal under 70-we will recheck at physical in 6 months and reassess

## 2019-02-20 NOTE — Patient Instructions (Signed)
There are no preventive care reminders to display for this patient.  Depression screen [facility] 2/9 05/23/2018 02/15/2017  Decreased Interest 0 0  Down, Depressed, Hopeless 0 1  PHQ - 2 Score 0 1    Recommended follow up: No follow-ups on file.

## 2019-03-17 ENCOUNTER — Encounter: Payer: Self-pay | Admitting: Family Medicine

## 2019-03-18 MED ORDER — METOPROLOL SUCCINATE ER 25 MG PO TB24: 25.0000 mg | ORAL_TABLET | Freq: Every day | ORAL | 3 refills | Status: DC

## 2019-04-09 ENCOUNTER — Telehealth: Payer: 59 | Admitting: Cardiology

## 2019-04-15 ENCOUNTER — Telehealth: Payer: Self-pay

## 2019-04-15 ENCOUNTER — Encounter: Payer: Self-pay | Admitting: Cardiology

## 2019-04-15 ENCOUNTER — Telehealth (INDEPENDENT_AMBULATORY_CARE_PROVIDER_SITE_OTHER): Payer: 59 | Admitting: Cardiology

## 2019-04-15 VITALS — BP 141/88 | HR 78 | Ht 71.0 in | Wt 220.0 lb

## 2019-04-15 DIAGNOSIS — I10 Essential (primary) hypertension: Secondary | ICD-10-CM

## 2019-04-15 DIAGNOSIS — R55 Syncope and collapse: Secondary | ICD-10-CM

## 2019-04-15 DIAGNOSIS — I252 Old myocardial infarction: Secondary | ICD-10-CM | POA: Diagnosis not present
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
## 2019-04-15 NOTE — Patient Instructions (Addendum)
Medication Instructions:  Your physician recommends that you continue on your current medications as directed. Please refer to the Current Medication list given to you today.  *If you need a refill on your cardiac medications before your next appointment, please call your pharmacy*  Lab Work: NONE ordered at this time of appointment   If you have labs (blood work) drawn today and your tests are completely normal, you will receive your results only by: Marland Kitchen MyChart Message (if you have MyChart) OR . A paper copy in the mail If you have any lab test that is abnormal or we need to change your treatment, we will call you to review the results.  Testing/Procedures: NONE ordered at this time of appointment   Follow-Up: At [facility], you and your health needs are our priority.  As part of our continuing mission to provide you with exceptional heart care, we have created designated Provider Care Teams.  These Care Teams include your primary Cardiologist (physician) and Advanced Practice Providers (APPs -  Physician Assistants and Nurse Practitioners) who all work together to provide you with the care you need, when you need it.  We recommend signing up for the patient portal called "MyChart".  Sign up information is provided on this After Visit Summary.  MyChart is used to connect with patients for Virtual Visits (Telemedicine).  Patients are able to view lab/test results, encounter notes, upcoming appointments, etc.  Non-urgent messages can be sent to your provider as well.   To learn more about what you can do with MyChart, go to [email or web address].    Your next appointment:   1 year(s)  The format for your next appointment:   In Person  Provider:   Sanda Klein, MD  Other Instructions  The Physician Assistant suggested to take Aspirin 81 mg daily-Can be brought over the counter

## 2019-04-15 NOTE — Progress Notes (Signed)
Thank you, Luke 

## 2019-04-15 NOTE — Telephone Encounter (Signed)

## 2019-04-15 NOTE — Telephone Encounter (Signed)
Called the patient to go his Telehealth AVS but did not get an answer and left a voice message for the patient to give the office a call back.

## 2019-04-25 ENCOUNTER — Encounter: Payer: Self-pay | Admitting: Family Medicine

## 2019-09-10 ENCOUNTER — Other Ambulatory Visit: Payer: Self-pay | Admitting: Family Medicine

## 2020-03-13 ENCOUNTER — Other Ambulatory Visit: Payer: Self-pay | Admitting: Family Medicine

## 2020-03-19 ENCOUNTER — Encounter: Payer: Self-pay | Admitting: Family Medicine

## 2020-03-22 ENCOUNTER — Other Ambulatory Visit: Payer: Self-pay

## 2020-03-22 MED ORDER — METOPROLOL SUCCINATE ER 25 MG PO TB24: 25.0000 mg | ORAL_TABLET | Freq: Every day | ORAL | 0 refills | Status: DC

## 2020-04-13 ENCOUNTER — Other Ambulatory Visit: Payer: Self-pay | Admitting: Family Medicine

## 2020-04-13 NOTE — Telephone Encounter (Signed)
Schedule an appointment with Dr.Hunter for further refills. 

## 2020-07-08 ENCOUNTER — Other Ambulatory Visit: Payer: Self-pay | Admitting: Family

## 2020-07-08 ENCOUNTER — Other Ambulatory Visit: Payer: Self-pay

## 2020-07-08 ENCOUNTER — Encounter: Payer: Self-pay | Admitting: Family

## 2020-07-08 ENCOUNTER — Ambulatory Visit (INDEPENDENT_AMBULATORY_CARE_PROVIDER_SITE_OTHER): Payer: 59 | Admitting: Family

## 2020-07-08 VITALS — BP 188/126 | HR 68 | Temp 97.7°F | Ht 71.0 in | Wt 233.8 lb

## 2020-07-08 DIAGNOSIS — I10 Essential (primary) hypertension: Secondary | ICD-10-CM

## 2020-07-08 DIAGNOSIS — R109 Unspecified abdominal pain: Secondary | ICD-10-CM

## 2020-07-08 DIAGNOSIS — E8881 Metabolic syndrome: Secondary | ICD-10-CM

## 2020-07-08 DIAGNOSIS — E785 Hyperlipidemia, unspecified: Secondary | ICD-10-CM

## 2020-07-08 DIAGNOSIS — E669 Obesity, unspecified: Secondary | ICD-10-CM

## 2020-07-08 LAB — LDL CHOLESTEROL, DIRECT: Direct LDL: 128 mg/dL

## 2020-07-08 LAB — LIPID PANEL
Cholesterol: 183 mg/dL (ref 0–200)
HDL: 35.1 mg/dL — ABNORMAL LOW (ref >39.00)
NonHDL: 147.53
Total CHOL/HDL Ratio: 5
Triglycerides: 207 mg/dL — ABNORMAL HIGH (ref 0.0–149.0)
VLDL: 41.4 mg/dL — ABNORMAL HIGH (ref 0.0–40.0)

## 2020-07-08 LAB — URINALYSIS, ROUTINE W REFLEX MICROSCOPIC
Bilirubin Urine: NEGATIVE
Hgb urine dipstick: NEGATIVE
Ketones, ur: NEGATIVE
Leukocytes,Ua: NEGATIVE
Nitrite: NEGATIVE
Specific Gravity, Urine: 1.025 (ref 1.000–1.030)
Total Protein, Urine: NEGATIVE
Urine Glucose: 250 — AB
Urobilinogen, UA: 2 — AB (ref 0.0–1.0)
pH: 6 (ref 5.0–8.0)

## 2020-07-08 LAB — COMPREHENSIVE METABOLIC PANEL
ALT: 61 U/L — ABNORMAL HIGH (ref 0–53)
AST: 44 U/L — ABNORMAL HIGH (ref 0–37)
Albumin: 4.1 g/dL (ref 3.5–5.2)
Alkaline Phosphatase: 115 U/L (ref 39–117)
BUN: 17 mg/dL (ref 6–23)
CO2: 24 mEq/L (ref 19–32)
Calcium: 9.2 mg/dL (ref 8.4–10.5)
Chloride: 105 mEq/L (ref 96–112)
Creatinine, Ser: 1.01 mg/dL (ref 0.40–1.50)
GFR: 84.59 mL/min (ref >60.00)
Glucose, Bld: 141 mg/dL — ABNORMAL HIGH (ref 70–99)
Potassium: 3.6 mEq/L (ref 3.5–5.1)
Sodium: 137 mEq/L (ref 135–145)
Total Bilirubin: 1.3 mg/dL — ABNORMAL HIGH (ref 0.2–1.2)
Total Protein: 7.2 g/dL (ref 6.0–8.3)

## 2020-07-08 MED ORDER — DILTIAZEM HCL ER COATED BEADS 240 MG PO CP24: ORAL_CAPSULE | ORAL | 1 refills | Status: DC

## 2020-07-08 MED ORDER — METOPROLOL SUCCINATE ER 25 MG PO TB24: 25.0000 mg | ORAL_TABLET | Freq: Every day | ORAL | 1 refills | Status: DC

## 2020-07-08 MED ORDER — TELMISARTAN 80 MG PO TABS: 80.0000 mg | ORAL_TABLET | Freq: Every day | ORAL | 1 refills | Status: DC

## 2020-07-08 NOTE — Patient Instructions (Signed)
Managing Your Hypertension Hypertension, also called high blood pressure, is when the force of the blood pressing against the walls of the arteries is too strong. Arteries are blood vessels that carry blood from your heart throughout your body. Hypertension forces the heart to work harder to pump blood and may cause the arteries to become narrow or stiff. Understanding blood pressure readings Your personal target blood pressure may vary depending on your medical conditions, your age, and other factors. A blood pressure reading includes a higher number over a lower number. Ideally, your blood pressure should be below 120/80. You should know that: The first, or top, number is called the systolic pressure. It is a measure of the pressure in your arteries as your heart beats. The second, or bottom number, is called the diastolic pressure. It is a measure of the pressure in your arteries as the heart relaxes. Blood pressure is classified into four stages. Based on your blood pressure reading, your health care provider may use the following stages to determine what type of treatment you need, if any. Systolic pressure and diastolic pressure are measured in a unit called mmHg. Normal Systolic pressure: below 546. Diastolic pressure: below 80. Elevated Systolic pressure: 568-127. Diastolic pressure: below 80. Hypertension stage 1 Systolic pressure: 517-001. Diastolic pressure: 74-94. Hypertension stage 2 Systolic pressure: 496 or above. Diastolic pressure: 90 or above. How can this condition affect me? Managing your hypertension is an important responsibility. Over time, hypertension can damage the arteries and decrease blood flow to important parts of the body, including the brain, heart, and kidneys. Having untreated or uncontrolled hypertension can lead to: A heart attack. A stroke. A weakened blood vessel (aneurysm). Heart failure. Kidney damage. Eye damage. Metabolic syndrome. Memory and  concentration problems. Vascular dementia. What actions can I take to manage this condition? Hypertension can be managed by making lifestyle changes and possibly by taking medicines. Your health care provider will help you make a plan to bring your blood pressure within a normal range. Nutrition Eat a diet that is high in fiber and potassium, and low in salt (sodium), added sugar, and fat. An example eating plan is called the Dietary Approaches to Stop Hypertension (DASH) diet. To eat this way: Eat plenty of fresh fruits and vegetables. Try to fill one-half of your plate at each meal with fruits and vegetables. Eat whole grains, such as whole-wheat pasta, brown rice, or whole-grain bread. Fill about one-fourth of your plate with whole grains. Eat low-fat dairy products. Avoid fatty cuts of meat, processed or cured meats, and poultry with skin. Fill about one-fourth of your plate with lean proteins such as fish, chicken without skin, beans, eggs, and tofu. Avoid pre-made and processed foods. These tend to be higher in sodium, added sugar, and fat. Reduce your daily sodium intake. Most people with hypertension should eat less than 1,500 mg of sodium a day.   Lifestyle Work with your health care provider to maintain a healthy body weight or to lose weight. Ask what an ideal weight is for you. Get at least 30 minutes of exercise that causes your heart to beat faster (aerobic exercise) most days of the week. Activities may include walking, swimming, or biking. Include exercise to strengthen your muscles (resistance exercise), such as weight lifting, as part of your weekly exercise routine. Try to do these types of exercises for 30 minutes at least 3 days a week. Do not use any products that contain nicotine or tobacco, such as cigarettes, e-cigarettes,  and chewing tobacco. If you need help quitting, ask your health care provider. Control any long-term (chronic) conditions you have, such as high  cholesterol or diabetes. Identify your sources of stress and find ways to manage stress. This may include meditation, deep breathing, or making time for fun activities.   Alcohol use Do not drink alcohol if: Your health care provider tells you not to drink. You are pregnant, may be pregnant, or are planning to become pregnant. If you drink alcohol: Limit how much you use to: 0-1 drink a day for women. 0-2 drinks a day for men. Be aware of how much alcohol is in your drink. In the [place] one drink equals one 12 oz bottle of beer (355 mL), one 5 oz glass of wine (148 mL), or one 1 oz glass of hard liquor (44 mL). Medicines Your health care provider may prescribe medicine if lifestyle changes are not enough to get your blood pressure under control and if: Your systolic blood pressure is 130 or higher. Your diastolic blood pressure is 80 or higher. Take medicines only as told by your health care provider. Follow the directions carefully. Blood pressure medicines must be taken as told by your health care provider. The medicine does not work as well when you skip doses. Skipping doses also puts you at risk for problems. Monitoring Before you monitor your blood pressure: Do not smoke, drink caffeinated beverages, or exercise within 30 minutes before taking a measurement. Use the bathroom and empty your bladder (urinate). Sit quietly for at least 5 minutes before taking measurements. Monitor your blood pressure at home as told by your health care provider. To do this: Sit with your back straight and supported. Place your feet flat on the floor. Do not cross your legs. Support your arm on a flat surface, such as a table. Make sure your upper arm is at heart level. Each time you measure, take two or three readings one minute apart and record the results. You may also need to have your blood pressure checked regularly by your health care provider.   General information Talk with your health care  provider about your diet, exercise habits, and other lifestyle factors that may be contributing to hypertension. Review all the medicines you take with your health care provider because there may be side effects or interactions. Keep all visits as told by your health care provider. Your health care provider can help you create and adjust your plan for managing your high blood pressure. Where to find more information National Heart, Lung, and Blood Institute: https://wilson-eaton.com/ American Heart Association: www.heart.org Contact a health care provider if: You think you are having a reaction to medicines you have taken. You have repeated (recurrent) headaches. You feel dizzy. You have swelling in your ankles. You have trouble with your vision. Get help right away if: You develop a severe headache or confusion. You have unusual weakness or numbness, or you feel faint. You have severe pain in your chest or abdomen. You vomit repeatedly. You have trouble breathing. These symptoms may represent a serious problem that is an emergency. Do not wait to see if the symptoms will go away. Get medical help right away. Call your local emergency services (911 in the [place] Do not drive yourself to the hospital. Summary Hypertension is when the force of blood pumping through your arteries is too strong. If this condition is not controlled, it may put you at risk for serious complications. Your personal target blood pressure  may vary depending on your medical conditions, your age, and other factors. For most people, a normal blood pressure is less than 120/80. Hypertension is managed by lifestyle changes, medicines, or both. Lifestyle changes to help manage hypertension include losing weight, eating a healthy, low-sodium diet, exercising more, stopping smoking, and limiting alcohol. This information is not intended to replace advice given to you by your health care provider. Make sure you discuss any questions  you have with your health care provider. Document Revised: 02/21/2019 Document Reviewed: 12/17/2018 Elsevier Patient Education  2021 [employer].  Low-Sodium Eating Plan Sodium, which is an element that makes up salt, helps you maintain a healthy balance of fluids in your body. Too much sodium can increase your blood pressure and cause fluid and waste to be held in your body. Your health care provider or dietitian may recommend following this plan if you have high blood pressure (hypertension), kidney disease, liver disease, or heart failure. Eating less sodium can help lower your blood pressure, reduce swelling, and protect your heart, liver, and kidneys. What are tips for following this plan? Reading food labels The Nutrition Facts label lists the amount of sodium in one serving of the food. If you eat more than one serving, you must multiply the listed amount of sodium by the number of servings. Choose foods with less than 140 mg of sodium per serving. Avoid foods with 300 mg of sodium or more per serving. Shopping Look for lower-sodium products, often labeled as "low-sodium" or "no salt added." Always check the sodium content, even if foods are labeled as "unsalted" or "no salt added." Buy fresh foods. Avoid canned foods and pre-made or frozen meals. Avoid canned, cured, or processed meats. Buy breads that have less than 80 mg of sodium per slice.   Cooking Eat more home-cooked food and less restaurant, buffet, and fast food. Avoid adding salt when cooking. Use salt-free seasonings or herbs instead of table salt or sea salt. Check with your health care provider or pharmacist before using salt substitutes. Cook with plant-based oils, such as canola, sunflower, or olive oil.   Meal planning When eating at a restaurant, ask that your food be prepared with less salt or no salt, if possible. Avoid dishes labeled as brined, pickled, cured, smoked, or made with soy sauce, miso, or teriyaki  sauce. Avoid foods that contain MSG (monosodium glutamate). MSG is sometimes added to [place] food, bouillon, and some canned foods. Make meals that can be grilled, baked, poached, roasted, or steamed. These are generally made with less sodium. General information Most people on this plan should limit their sodium intake to 1,500-2,000 mg (milligrams) of sodium each day. What foods should I eat? Fruits Fresh, frozen, or canned fruit. Fruit juice. Vegetables Fresh or frozen vegetables. "No salt added" canned vegetables. "No salt added" tomato sauce and paste. Low-sodium or reduced-sodium tomato and vegetable juice. Grains Low-sodium cereals, including oats, puffed wheat and rice, and shredded wheat. Low-sodium crackers. Unsalted rice. Unsalted pasta. Low-sodium bread. Whole-grain breads and whole-grain pasta. Meats and other proteins Fresh or frozen (no salt added) meat, poultry, seafood, and fish. Low-sodium canned tuna and salmon. Unsalted nuts. Dried peas, beans, and lentils without added salt. Unsalted canned beans. Eggs. Unsalted nut butters. Dairy Milk. Soy milk. Cheese that is naturally low in sodium, such as ricotta cheese, fresh mozzarella, or Swiss cheese. Low-sodium or reduced-sodium cheese. Cream cheese. Yogurt. Seasonings and condiments Fresh and dried herbs and spices. Salt-free seasonings. Low-sodium mustard and ketchup.  Sodium-free salad dressing. Sodium-free light mayonnaise. Fresh or refrigerated horseradish. Lemon juice. Vinegar. Other foods Homemade, reduced-sodium, or low-sodium soups. Unsalted popcorn and pretzels. Low-salt or salt-free chips. The items listed above may not be a complete list of foods and beverages you can eat. Contact a dietitian for more information. What foods should I avoid? Vegetables Sauerkraut, pickled vegetables, and relishes. Olives. [place] fries. Onion rings. Regular canned vegetables (not low-sodium or reduced-sodium). Regular canned tomato  sauce and paste (not low-sodium or reduced-sodium). Regular tomato and vegetable juice (not low-sodium or reduced-sodium). Frozen vegetables in sauces. Grains Instant hot cereals. Bread stuffing, pancake, and biscuit mixes. Croutons. Seasoned rice or pasta mixes. Noodle soup cups. Boxed or frozen macaroni and cheese. Regular salted crackers. Self-rising flour. Meats and other proteins Meat or fish that is salted, canned, smoked, spiced, or pickled. Precooked or cured meat, such as sausages or meat loaves. Berniece Salines. Ham. Pepperoni. Hot dogs. Corned beef. Chipped beef. Salt pork. Jerky. Pickled herring. Anchovies and sardines. Regular canned tuna. Salted nuts. Dairy Processed cheese and cheese spreads. Hard cheeses. Cheese curds. Blue cheese. Feta cheese. String cheese. Regular cottage cheese. Buttermilk. Canned milk. Fats and oils Salted butter. Regular margarine. Ghee. Bacon fat. Seasonings and condiments Onion salt, garlic salt, seasoned salt, table salt, and sea salt. Canned and packaged gravies. Worcestershire sauce. Tartar sauce. Barbecue sauce. Teriyaki sauce. Soy sauce, including reduced-sodium. Steak sauce. Fish sauce. Oyster sauce. Cocktail sauce. Horseradish that you find on the shelf. Regular ketchup and mustard. Meat flavorings and tenderizers. Bouillon cubes. Hot sauce. Pre-made or packaged marinades. Pre-made or packaged taco seasonings. Relishes. Regular salad dressings. Salsa. Other foods Salted popcorn and pretzels. Corn chips and puffs. Potato and tortilla chips. Canned or dried soups. Pizza. Frozen entrees and pot pies. The items listed above may not be a complete list of foods and beverages you should avoid. Contact a dietitian for more information. Summary Eating less sodium can help lower your blood pressure, reduce swelling, and protect your heart, liver, and kidneys. Most people on this plan should limit their sodium intake to 1,500-2,000 mg (milligrams) of sodium each  day. Canned, boxed, and frozen foods are high in sodium. Restaurant foods, fast foods, and pizza are also very high in sodium. You also get sodium by adding salt to food. Try to cook at home, eat more fresh fruits and vegetables, and eat less fast food and canned, processed, or prepared foods. This information is not intended to replace advice given to you by your health care provider. Make sure you discuss any questions you have with your health care provider. Document Revised: 02/21/2019 Document Reviewed: 12/18/2018 Elsevier Patient Education  2021 [employer].
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
## 2020-07-12 ENCOUNTER — Telehealth: Payer: Self-pay

## 2020-07-12 NOTE — Telephone Encounter (Signed)
Nurse Assessment Nurse: White, RN, Sharen Hint Date/Time (Eastern Time): 07/09/2020 5:56:27 PM Confirm and document reason for call. If symptomatic, describe symptoms. ---Caller states was seen yesterday, prescribe BP medicine, went to [facility] to [facility] it up but they don't accept his insurance, needing it transfer to another pharmacy. Diltiazem, Metoprolol, Talmasartan for BP. Caller denies any symptoms currently and stated he will go to [facility] pharmacy that he has used previously to ask for a loaner dose. Advised caller to call back for any new or worsening of symptoms. Caller stated he will probably go on to an ED for any symptoms he may experience. No triage completed at this time. Does the patient have any new or worsening symptoms? ---No Disp. Time Eilene Ghazi Time) Disposition Final User 07/09/2020 5:33:50 PM Attempt made - message left Dema Severin RN, [facility] 07/09/2020 6:07:07 PM Clinical Call Yes Dema Severin, RN, [facility]

## 2020-07-12 NOTE — Telephone Encounter (Signed)
Called and lm for pt tcb to see what pharmacy he wants to use.

## 2020-07-13 NOTE — Telephone Encounter (Signed)
2nd attempt to contact pt. Lvm for pt to return call to the office.

## 2020-07-15 NOTE — Telephone Encounter (Signed)
3rd attempt to reach the patient , was unsuccessful. [place]

## 2020-08-05 ENCOUNTER — Encounter: Payer: Self-pay | Admitting: Family Medicine

## 2020-08-05 ENCOUNTER — Ambulatory Visit (INDEPENDENT_AMBULATORY_CARE_PROVIDER_SITE_OTHER): Payer: 59 | Admitting: Family Medicine

## 2020-08-05 ENCOUNTER — Other Ambulatory Visit: Payer: Self-pay

## 2020-08-05 VITALS — BP 133/88 | HR 77 | Temp 98.3°F | Ht 71.0 in | Wt 226.4 lb

## 2020-08-05 DIAGNOSIS — I252 Old myocardial infarction: Secondary | ICD-10-CM | POA: Diagnosis not present

## 2020-08-05 DIAGNOSIS — E785 Hyperlipidemia, unspecified: Secondary | ICD-10-CM | POA: Diagnosis not present

## 2020-08-05 DIAGNOSIS — I10 Essential (primary) hypertension: Secondary | ICD-10-CM | POA: Diagnosis not present

## 2020-08-05 MED ORDER — DILTIAZEM HCL ER COATED BEADS 300 MG PO CP24: ORAL_CAPSULE | ORAL | 3 refills | Status: DC

## 2020-08-05 NOTE — Patient Instructions (Addendum)
Health Maintenance Due  Topic Date Due   Hepatitis C Screening Patient just had this done last week will bring labs in after his visit today.  Never done   Zoster Vaccines- Shingrix (1 of 2) - opts out for now (never had chicken pox and had negative titers later) Never done   Increase diltiazem to 300 mg. Please update me within 2-3 weeks through [place].  Recommended follow up: Return in about 6 weeks (around 09/16/2020) for follow-up- or soone if needed.

## 2020-08-05 NOTE — Progress Notes (Signed)
Phone [phone] In person visit   Subjective:   Andrew Li is a 55 y.o. year old very pleasant male patient who presents for/with See problem oriented charting Chief Complaint  Patient presents with   Hypertension    This visit occurred during the SARS-CoV-2 public health emergency.  Safety protocols were in place, including screening questions prior to the visit, additional usage of staff PPE, and extensive cleaning of exam room while observing appropriate contact time as indicated for disinfecting solutions.   Past Medical History-  Patient Active Problem List   Diagnosis Date Noted   Normal coronary arteries 04/20/2016    Priority: High   History of non-ST elevation myocardial infarction (NSTEMI) 04/05/2016    Priority: High   Diplopia 11/17/2014    Priority: High   Vasovagal syncope 09/17/2013    Priority: High   Hyperlipidemia, unspecified 02/20/2019    Priority: Medium   Essential hypertension 09/17/2013    Priority: Medium   Elevated LFTs 02/15/2017    Priority: Low   Former smoker 02/15/2017    Priority: Low   Abnormal CT scan of lung 02/15/2017    Priority: Low   Nephrolithiasis 01/25/2017    Priority: Low   Ocular migraine 11/17/2014    Priority: Low    Medications- reviewed and updated Current Outpatient Medications  Medication Sig Dispense Refill   aspirin 325 MG tablet Take 325 mg by mouth every 6 (six) hours as needed for headache.      b complex vitamins tablet Take 1 tablet by mouth daily.     Cholecalciferol (VITAMIN D3) 125 MCG (5000 UT) TABS Take 5,000 Units by mouth daily.      Citrulline 1 g PACK Take by mouth.     Coenzyme Q10 (CO Q 10 PO) Take 60 mg by mouth daily.      COPPER PO Take 2 mg by mouth daily.      Melatonin 5 MG CAPS Take by mouth.     metoprolol succinate (TOPROL-XL) 25 MG 24 hr tablet Take 1 tablet (25 mg total) by mouth daily. 90 tablet 1   nitroGLYCERIN (NITROSTAT) 0.4 MG SL tablet Place 1 tablet (0.4 mg total)  under the tongue every 5 (five) minutes x 3 doses as needed for chest pain. 25 tablet 12   telmisartan (MICARDIS) 80 MG tablet Take 1 tablet (80 mg total) by mouth daily. 90 tablet 1   vitamin C (ASCORBIC ACID) 500 MG tablet Take 500 mg by mouth daily.     vitamin E 200 UNIT capsule Take 200 Units by mouth daily.     VITAMIN K PO Take 600 mg by mouth.     diltiazem (CARDIZEM CD) 300 MG 24 hr capsule TAKE 1 CAPSULE BY MOUTH EVERY DAY 90 capsule 3   No current facility-administered medications for this visit.     Objective:  BP 133/88 Comment: most recent home reading  Pulse 77   Temp 98.3 F (36.8 C) (Temporal)   Ht 5\' 11"  (1.803 m)   Wt 226 lb 6.4 oz (102.7 kg)   SpO2 95%   BMI 31.58 kg/m  Gen: NAD, resting comfortably CV: RRR no murmurs rubs or gallops Lungs: CTAB no crackles, wheeze, rhonchi Abdomen: soft/nontender/nondistended/normal bowel sounds.  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 Was 163/108 initially today. Sometimes gets into the low 90s at home.  BP Readings from Last 3 Encounters:  08/05/20 133/88  07/08/20 (!) 188/126  04/15/19 (!) 141/88  A/P: Poor control of diastolic readings at home averaging over 85-we will increase diltiazem to 300 mg and continue other medications. - I am going to evaluate for pheochromocytoma though  do not strongly suspect-and also reach out to Dr. Loletha Grayer to see if he has other concern about the metoprolol 25 mg (in 2018 asked for no unopposed beta blockers). No cocaine use ever (in light of stemi) = continue for now metoprolol and telmisartan - in regards to glucose in urine last visit- had coffee with a lot of creamer and glucose was 141 non fasting. Did have ast/alt slightly high- will need to monitor that- possible fatty liver. Patient states did a hepatitis c panel with [facility] along with std screening through any lab test now- he does this to be cautious. We will monitor LFTs at follow up -2-3 week mychart update and 4-6 weeks in person  #hyperlipidemia #History of NSTEMI-no significant CAD March 2018 catheterization-last seen by Dr. Sallyanne Kuster 2018-thought potentially related to excessive hypertension or vasospasm S: Medication:declined in the past to take statin here and at cardiology-patient is compliant with aspirin -has been heavier in carbs in last 6 months due to cost of proteins- #s slipped up on last check Lab Results  Component Value Date   CHOL 183 07/08/2020   HDL 35.10 (L) 07/08/2020   LDLCALC 104 (H) 08/22/2018   LDLDIRECT 128.0 07/08/2020   TRIG 207.0 (H) 07/08/2020   CHOLHDL 5 07/08/2020   A/P: poor contorl- with nstemi history even though coronaries without obstructive disease would still advise statin- he declines again - coronary calcium of 0 in 2012- wants to repeat -Of note hyperlipidemia previously listed as associated with diabetes-to clarify he does not have diabetes- appears entered in error during prior visit    #Declines COVID-19 vaccination despite counseling- had covid 19 last year and did not affect him that negatively and has had more exposures without illness . Also takes vitamin D  Recommended follow up: No follow-ups on file. No future appointments.   Lab/Order associations:   ICD-10-CM   1. History of non-ST elevation myocardial infarction (NSTEMI)   I25.2     2. Hyperlipidemia, unspecified hyperlipidemia type  E78.5 CT CARDIAC SCORING (SELF PAY ONLY)    3. Severe hypertension  I10 Metanephrines, Urine, 24 hour    Catecholamines, fractionated, Urine, 24 hour      Meds ordered this encounter  Medications   diltiazem (CARDIZEM CD) 300 MG 24 hr capsule    Sig: TAKE 1 CAPSULE BY MOUTH EVERY DAY    Dispense:  90 capsule    Refill:  3   I,Harris Phan,acting as a scribe for [name], MD.,have documented all relevant documentation on the behalf of [name], MD,as directed by  [name], MD while in the presence of [name], MD.  I, [name], MD, have reviewed all documentation for this visit.  The documentation on 08/05/20 for the exam, diagnosis, procedures, and orders are all accurate and complete.   Return precautions advised.  [name], MD

## 2020-08-10 ENCOUNTER — Telehealth: Payer: Self-pay

## 2020-08-10 ENCOUNTER — Other Ambulatory Visit: Payer: Self-pay

## 2020-08-10 DIAGNOSIS — I10 Essential (primary) hypertension: Secondary | ICD-10-CM

## 2020-08-10 MED ORDER — CARVEDILOL 6.25 MG PO TABS: 6.2500 mg | ORAL_TABLET | Freq: Two times a day (BID) | ORAL | 3 refills | Status: DC

## 2020-08-10 NOTE — Telephone Encounter (Signed)
-----   Message from Marin Olp, MD sent at 08/05/2020  9:08 PM EDT ----- Team please stop his metoprolol xr and start carvedilol 6.25 mg twice daily #60 with 5 refills. I may increase dose at next visit. Please tell him this was in discussion with cardiology but have him collect urine before making the switch. Try to do the urine test as soon as possible  THanks, [name]  ----- Message ----- From: Sanda Klein, MD Sent: 08/05/2020   3:19 PM EDT To: Ricci Barker, RN, Marin Olp, MD  Hello, Dr. Yong Channel Thanks for keeping me updated.  "No opposed beta blockers" was recommended due to the suspicion for coronary spasm, but either carvedilol or Bystolic should be good choices for BP control with combined alpha+beta blockade. Diltiazem is also a great choice if he has coronary vasospasm. If his heart rate gets too slow with combination of diltiazem and beta blocker, can swap diltiazem out for amlodipine or another long-acting dihydropyridine. Now is a great time to check for pheochromocytoma, since he is off antiadrenergic drugs, so go for it! I would love to see him back, but was just told I do not have a follow up appointment opening until November. If OK with you, I would be very grateful if you could see him until I can get him in. Lattie Haw, can you please find a slot for him?). Greatly appreciated! [name] ----- Message ----- From: Marin Olp, MD Sent: 08/05/2020   3:00 PM EDT To: Sanda Klein, MD  Hey Dr. Loletha Grayer,   You saw patient last in 2018. History of nstemi unclear cause. I have been seeing him more recently for BP follow up- I had prescribed him metoprolol in past but just saw in your note from 2018- no unopposed beta blockers. Notes also say avoid diuretics- neurally mediated syncope plus kidney stones.   He has been on diltiazem 240mg  ER, metoprolol 25 mg ER, and telmisartan 80mg . Had been lost to follow up and had run out of BP meds. When he was off meds recently  BP was >180/120 (thankfully no recurrent nstemi!).  I was going to check urine metanephrines and catecholamines. No known cocaine use. I wanted to see if you were ok with the above plus I increased diltiazem to 300mg . In last note with cardiology last year they were differing management of BP to PCP but wanted to get your feedback (I am also happy to get him back in with you for a visit)  Thanks, [name]- PCP

## 2020-08-10 NOTE — Telephone Encounter (Signed)
Called and spoke with pt and pt made aware, Metoprolol has been dc'd and Carvedilol sent in. Pt states he will get by Thursday to leave urine sample.

## 2020-08-12 ENCOUNTER — Other Ambulatory Visit: Payer: 59

## 2020-08-12 DIAGNOSIS — I10 Essential (primary) hypertension: Secondary | ICD-10-CM

## 2020-08-12 NOTE — Telephone Encounter (Signed)
Tried to contact pt mailbox full unable to leave message will try again later.

## 2020-08-12 NOTE — Telephone Encounter (Signed)
Spoke to pt told him just wanted to clarify labs ordered and medication. Told pt POCT UA was not done, Dr. Yong Channel order to other tests for your urine Catecholamines and Metanephrines. Pt verbalized understanding. Told pt to stop the Metoprolol and start Carvedilol 6.25 mg twice a day. Pt verbalized understanding and said he did stop the Metoprolol and will pick up the Carvedilol later today. Told pt okay as soon as labs come back on urine we will contact you. Pt verbalized understanding.

## 2020-08-20 LAB — CATECHOLAMINES, FRACTIONATED, URINE, 24 HOUR
Calc Total (E+NE): 57 mcg/24 h (ref 26–121)
Creatinine, Urine mg/day-CATEUR: 1.67 g/(24.h) (ref 0.50–2.15)
Dopamine 24 Hr Urine: 143 mcg/24 h (ref 52–480)
Norepinephrine, 24H, Ur: 57 mcg/24 h (ref 15–100)
Total Volume: 3000 mL

## 2020-08-20 LAB — METANEPHRINES, URINE, 24 HOUR
METANEPHRINE: 69 mcg/24 h — ABNORMAL LOW (ref 90–315)
METANEPHRINES, TOTAL: 502 mcg/24 h (ref 224–832)
NORMETANEPHRINE: 433 mcg/24 h (ref 122–676)
Total Volume: 3000 mL

## 2020-09-06 ENCOUNTER — Other Ambulatory Visit: Payer: Self-pay | Admitting: Family

## 2020-09-06 NOTE — Progress Notes (Signed)
Phone [phone] In person visit   Subjective:   Andrew Li is a 55 y.o. year old very pleasant male patient who presents for/with See problem oriented charting Chief Complaint  Patient presents with   Hyperlipidemia   Hypertension    This visit occurred during the SARS-CoV-2 public health emergency.  Safety protocols were in place, including screening questions prior to the visit, additional usage of staff PPE, and extensive cleaning of exam room while observing appropriate contact time as indicated for disinfecting solutions.   Past Medical History-  Patient Active Problem List   Diagnosis Date Noted   Normal coronary arteries 04/20/2016    Priority: High   History of non-ST elevation myocardial infarction (NSTEMI) 04/05/2016    Priority: High   Diplopia 11/17/2014    Priority: High   Vasovagal syncope 09/17/2013    Priority: High   Hyperlipidemia, unspecified 02/20/2019    Priority: Medium   Essential hypertension 09/17/2013    Priority: Medium   Elevated LFTs 02/15/2017    Priority: Low   Former smoker 02/15/2017    Priority: Low   Abnormal CT scan of lung 02/15/2017    Priority: Low   Nephrolithiasis 01/25/2017    Priority: Low   Ocular migraine 11/17/2014    Priority: Low    Medications- reviewed and updated Current Outpatient Medications  Medication Sig Dispense Refill   aspirin 325 MG tablet Take 325 mg by mouth every 6 (six) hours as needed for headache.      b complex vitamins tablet Take 1 tablet by mouth daily.     Cholecalciferol (VITAMIN D3) 125 MCG (5000 UT) TABS Take 5,000 Units by mouth daily.      Citrulline 1 g PACK Take by mouth.     Coenzyme Q10 (CO Q 10 PO) Take 60 mg by mouth daily.      COPPER PO Take 2 mg by mouth daily.      diltiazem (CARDIZEM CD) 300 MG 24 hr capsule TAKE 1 CAPSULE BY MOUTH EVERY DAY 90 capsule 3   Melatonin 5 MG CAPS Take by mouth.     nitroGLYCERIN (NITROSTAT) 0.4 MG SL tablet Place 1 tablet (0.4 mg total)  under the tongue every 5 (five) minutes x 3 doses as needed for chest pain. 25 tablet 12   vitamin C (ASCORBIC ACID) 500 MG tablet Take 500 mg by mouth daily.     vitamin E 200 UNIT capsule Take 200 Units by mouth daily.     VITAMIN K PO Take 600 mg by mouth.     carvedilol (COREG) 6.25 MG tablet Take 1 tablet (6.25 mg total) by mouth 2 (two) times daily with a meal. 180 tablet 3   telmisartan (MICARDIS) 80 MG tablet Take 1 tablet (80 mg total) by mouth daily. 90 tablet 3   No current facility-administered medications for this visit.     Objective:  BP 115/75   Pulse 78   Temp 97.7 F (36.5 C) (Temporal)   Ht '5\' 11"'$  (1.803 m)   Wt 219 lb 12.8 oz (99.7 kg)   SpO2 95%   BMI 30.66 kg/m  Gen: NAD, resting comfortably CV: RRR no murmurs rubs or gallops Lungs: CTAB no crackles, wheeze, rhonchi Ext: no edema Skin: warm, dry    Assessment and Plan   #hypertension S: medication: diltiazem 300 mg daily ER increased from 240 last visit and Telmisartan 80 mg daily, coreg 6.25 mg twice daily with meal-prior on metoprolol 25 mg extended  release-Dr. C of cardiology was okay with change -Per last cardiology note March 2021 they recommended to avoid diuretics due to neurally mediated syncope in the past at the gym plus kidney stone history -Pheochromocytoma testing negative Home readings #s: Home readings vary significantly with numbers often in the [id number] or [id number] systolic but other times as low as 110s sporadically. Is getting headaches still on days that BP is high  Chronic headache issues- had MRI 11/27/14. Double vision for 10 years as well- no recent worsening- plans to follow up with optho- reports prior work up  Down 7 lbs- cutting carbs and trying to lose weight BP Readings from Last 3 Encounters:  09/08/20 115/75  08/05/20 133/88  07/08/20 (!) 188/126  A/P: blood pressure well controlled in office. Poor control at home- will bring cuff to next visit. We considered increasing meds- but  with lower BP here and some readings at home in 110s we opted to hold off. For now will continue current meds- did consider increasing coreg - great job on weight loss  #hyperlipidemia #history of NSTEMI - no significant CAD march 2018 catheterization - last seen by Dr. Croitoru 2018- thought potentially related to excessive HTN or vasospasm S: Medication: declined in the past to take statin here and at cardiology-patient is compliant with aspirin 325 mg, every 6 hours as needed , nitrostat 0.4 mg as needed-patient is not needing this -weight loss as above Lab Results  Component Value Date   CHOL 183 07/08/2020   HDL 35.10 (L) 07/08/2020   LDLCALC 104 (H) 08/22/2018   LDLDIRECT 128.0 07/08/2020   TRIG 207.0 (H) 07/08/2020   CHOLHDL 5 07/08/2020   A/P: history of NSTEMI but clean coronaries- we had considered ct cardiac scoring- he is going to call to schedule- had wanted to hold off on statin until more info through that made available  #Mild LFT elevations- continue to work on weight loss. Possible fatty liver Lab Results  Component Value Date   ALT 61 (H) 07/08/2020   AST 44 (H) 07/08/2020   ALKPHOS 115 07/08/2020   BILITOT 1.3 (H) 07/08/2020   #new insurance -plans to see optho -plans to get [facility] history -plans to get follow up with cardiology  #Declined COVID-19 vaccination had covid-19 last year and did not affect him that negatively and has had more exposures without illness . Also takes vitamin D   Recommended follow up: 4-6 months for next available CPE  Lab/Order associations:   ICD-10-CM   1. Essential hypertension  I10     2. Hyperlipidemia, unspecified hyperlipidemia type  E78.5      Meds ordered this encounter  Medications   telmisartan (MICARDIS) 80 MG tablet    Sig: Take 1 tablet (80 mg total) by mouth daily.    Dispense:  90 tablet    Refill:  3   carvedilol (COREG) 6.25 MG tablet    Sig: Take 1 tablet (6.25 mg  total) by mouth 2 (two) times daily with a meal.    Dispense:  180 tablet    Refill:  3    I,Jada Bradford,acting as a scribe for [name], MD.,have documented all relevant documentation on the behalf of [name], MD,as directed by  [name], MD while in the presence of [name], MD.   I, [name], MD, have reviewed all documentation for this visit. The documentation on 09/08/20 for the exam, diagnosis, procedures, and orders are all accurate and  complete.  Return precautions advised.   [name], MD

## 2020-09-07 ENCOUNTER — Encounter: Payer: Self-pay | Admitting: Family Medicine

## 2020-09-08 ENCOUNTER — Other Ambulatory Visit: Payer: Self-pay

## 2020-09-08 ENCOUNTER — Encounter: Payer: Self-pay | Admitting: Family Medicine

## 2020-09-08 ENCOUNTER — Ambulatory Visit (INDEPENDENT_AMBULATORY_CARE_PROVIDER_SITE_OTHER): Payer: 59 | Admitting: Family Medicine

## 2020-09-08 VITALS — BP 115/75 | HR 78 | Temp 97.7°F | Ht 71.0 in | Wt 219.8 lb

## 2020-09-08 DIAGNOSIS — I10 Essential (primary) hypertension: Secondary | ICD-10-CM | POA: Diagnosis not present

## 2020-09-08 DIAGNOSIS — Z23 Encounter for immunization: Secondary | ICD-10-CM | POA: Diagnosis not present

## 2020-09-08 DIAGNOSIS — E785 Hyperlipidemia, unspecified: Secondary | ICD-10-CM

## 2020-09-08 MED ORDER — TELMISARTAN 80 MG PO TABS: 80.0000 mg | ORAL_TABLET | Freq: Every day | ORAL | 3 refills | Status: DC

## 2020-09-08 MED ORDER — CARVEDILOL 6.25 MG PO TABS: 6.2500 mg | ORAL_TABLET | Freq: Two times a day (BID) | ORAL | 3 refills | Status: DC

## 2020-09-08 NOTE — Addendum Note (Signed)
Addended by: Linton Ham on: 09/08/2020 11:02 AM   Modules accepted: Orders

## 2020-09-08 NOTE — Patient Instructions (Addendum)
Health Maintenance Due  Topic Date Due   Zoster Vaccines- Shingrix (1 of 2)   -Done today in office.  Never done   INFLUENZA VACCINE Not Available in office yet.  - Please consider getting your flu shot in the Fall. If you get this outside of our office, please let us know.   08/30/2020   Please continue current blood pressure medications and bring home cuff with home reading to next visit.  I love your motivation to go back to the gym! Great job on weight loss  Recommended follow up: Return in about 4 months (around 01/08/2021) for physical. or sooner as needed.Marland Kitchen

## 2020-11-09 ENCOUNTER — Ambulatory Visit (INDEPENDENT_AMBULATORY_CARE_PROVIDER_SITE_OTHER): Payer: BC Managed Care – PPO

## 2020-11-09 ENCOUNTER — Other Ambulatory Visit: Payer: Self-pay

## 2020-11-09 DIAGNOSIS — Z23 Encounter for immunization: Secondary | ICD-10-CM | POA: Diagnosis not present

## 2020-11-09 NOTE — Progress Notes (Signed)
Shingles #2 vaccine was given in the office today, he tolerated well.

## 2021-01-09 ENCOUNTER — Other Ambulatory Visit: Payer: Self-pay | Admitting: Family

## 2021-01-10 ENCOUNTER — Other Ambulatory Visit: Payer: Self-pay | Admitting: Family

## 2021-03-08 ENCOUNTER — Other Ambulatory Visit: Payer: Self-pay

## 2021-03-08 ENCOUNTER — Encounter: Payer: Self-pay | Admitting: Family Medicine

## 2021-03-08 ENCOUNTER — Ambulatory Visit (INDEPENDENT_AMBULATORY_CARE_PROVIDER_SITE_OTHER): Payer: BC Managed Care – PPO | Admitting: Family Medicine

## 2021-03-08 VITALS — BP 160/90 | HR 86 | Temp 98.8°F | Ht 71.0 in | Wt 231.6 lb

## 2021-03-08 DIAGNOSIS — Z0000 Encounter for general adult medical examination without abnormal findings: Secondary | ICD-10-CM

## 2021-03-08 DIAGNOSIS — I10 Essential (primary) hypertension: Secondary | ICD-10-CM | POA: Diagnosis not present

## 2021-03-08 DIAGNOSIS — R7989 Other specified abnormal findings of blood chemistry: Secondary | ICD-10-CM | POA: Diagnosis not present

## 2021-03-08 DIAGNOSIS — E785 Hyperlipidemia, unspecified: Secondary | ICD-10-CM

## 2021-03-08 DIAGNOSIS — R739 Hyperglycemia, unspecified: Secondary | ICD-10-CM

## 2021-03-08 MED ORDER — CARVEDILOL 12.5 MG PO TABS: 12.5000 mg | ORAL_TABLET | Freq: Two times a day (BID) | ORAL | 3 refills | Status: DC

## 2021-03-08 NOTE — Patient Instructions (Addendum)
poor control today- will increase carvedilol to 12.5 mg twice daily and he will update me in 3-4 weeks with home readings  Schedule a lab visit at the check out desk within 2 weeks. Return for future fasting labs meaning nothing but water after midnight please. Ok to take your medications with water.   We will call you within two weeks about your referral to ct cardiac scoring. If you do not hear within 2 weeks, give [place] a call.    Recommended follow up: Return in about 6 months (around 09/05/2021) for follow up- or sooner if needed. Particularly if blood remains high- goal <135/85 at home on average
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
## 2021-03-22 ENCOUNTER — Encounter: Payer: Self-pay | Admitting: Family Medicine

## 2021-03-22 ENCOUNTER — Other Ambulatory Visit: Payer: Self-pay

## 2021-03-22 ENCOUNTER — Other Ambulatory Visit (INDEPENDENT_AMBULATORY_CARE_PROVIDER_SITE_OTHER): Payer: BC Managed Care – PPO

## 2021-03-22 DIAGNOSIS — E785 Hyperlipidemia, unspecified: Secondary | ICD-10-CM

## 2021-03-22 DIAGNOSIS — R739 Hyperglycemia, unspecified: Secondary | ICD-10-CM

## 2021-03-22 DIAGNOSIS — E119 Type 2 diabetes mellitus without complications: Secondary | ICD-10-CM | POA: Insufficient documentation

## 2021-03-22 DIAGNOSIS — I10 Essential (primary) hypertension: Secondary | ICD-10-CM

## 2021-03-22 LAB — CBC WITH DIFFERENTIAL/PLATELET
Basophils Absolute: 0.1 10*3/uL (ref 0.0–0.1)
Basophils Relative: 1 % (ref 0.0–3.0)
Eosinophils Absolute: 0.2 10*3/uL (ref 0.0–0.7)
Eosinophils Relative: 3.2 % (ref 0.0–5.0)
HCT: 44.4 % (ref 39.0–52.0)
Hemoglobin: 15.4 g/dL (ref 13.0–17.0)
Lymphocytes Relative: 47.1 % — ABNORMAL HIGH (ref 12.0–46.0)
Lymphs Abs: 2.8 10*3/uL (ref 0.7–4.0)
MCHC: 34.8 g/dL (ref 30.0–36.0)
MCV: 92.4 fl (ref 78.0–100.0)
Monocytes Absolute: 0.6 10*3/uL (ref 0.1–1.0)
Monocytes Relative: 10.7 % (ref 3.0–12.0)
Neutro Abs: 2.3 10*3/uL (ref 1.4–7.7)
Neutrophils Relative %: 38 % — ABNORMAL LOW (ref 43.0–77.0)
Platelets: 228 10*3/uL (ref 150.0–400.0)
RBC: 4.81 Mil/uL (ref 4.22–5.81)
RDW: 14.2 % (ref 11.5–15.5)
WBC: 6 10*3/uL (ref 4.0–10.5)

## 2021-03-22 LAB — COMPREHENSIVE METABOLIC PANEL
ALT: 58 U/L — ABNORMAL HIGH (ref 0–53)
AST: 43 U/L — ABNORMAL HIGH (ref 0–37)
Albumin: 4 g/dL (ref 3.5–5.2)
Alkaline Phosphatase: 110 U/L (ref 39–117)
BUN: 15 mg/dL (ref 6–23)
CO2: 25 mEq/L (ref 19–32)
Calcium: 8.7 mg/dL (ref 8.4–10.5)
Chloride: 104 mEq/L (ref 96–112)
Creatinine, Ser: 1.05 mg/dL (ref 0.40–1.50)
GFR: 80.34 mL/min (ref >60.00)
Glucose, Bld: 175 mg/dL — ABNORMAL HIGH (ref 70–99)
Potassium: 3.8 mEq/L (ref 3.5–5.1)
Sodium: 137 mEq/L (ref 135–145)
Total Bilirubin: 0.8 mg/dL (ref 0.2–1.2)
Total Protein: 6.8 g/dL (ref 6.0–8.3)

## 2021-03-22 LAB — HEMOGLOBIN A1C: Hgb A1c MFr Bld: 7.3 % — ABNORMAL HIGH (ref 4.6–6.5)

## 2021-03-23 ENCOUNTER — Other Ambulatory Visit: Payer: Self-pay

## 2021-03-23 MED ORDER — METFORMIN HCL 500 MG PO TABS: 500.0000 mg | ORAL_TABLET | Freq: Every day | ORAL | 3 refills | Status: DC

## 2021-04-27 ENCOUNTER — Inpatient Hospital Stay: Admission: RE | Admit: 2021-04-27 | Payer: Self-pay | Source: Ambulatory Visit

## 2021-09-06 ENCOUNTER — Ambulatory Visit: Payer: BC Managed Care – PPO | Admitting: Family Medicine

## 2021-09-06 ENCOUNTER — Encounter: Payer: Self-pay | Admitting: Family Medicine

## 2021-09-06 VITALS — BP 120/74 | HR 83 | Temp 98.6°F | Ht 71.0 in | Wt 221.2 lb

## 2021-09-06 DIAGNOSIS — I10 Essential (primary) hypertension: Secondary | ICD-10-CM | POA: Diagnosis not present

## 2021-09-06 DIAGNOSIS — E119 Type 2 diabetes mellitus without complications: Secondary | ICD-10-CM

## 2021-09-06 DIAGNOSIS — Z1211 Encounter for screening for malignant neoplasm of colon: Secondary | ICD-10-CM | POA: Diagnosis not present

## 2021-09-06 DIAGNOSIS — E785 Hyperlipidemia, unspecified: Secondary | ICD-10-CM

## 2021-09-06 LAB — LIPID PANEL
Cholesterol: 218 mg/dL — ABNORMAL HIGH (ref 0–200)
HDL: 30.6 mg/dL — ABNORMAL LOW (ref >39.00)
Total CHOL/HDL Ratio: 7
Triglycerides: 644 mg/dL — ABNORMAL HIGH (ref 0.0–149.0)

## 2021-09-06 LAB — HEMOGLOBIN A1C: Hgb A1c MFr Bld: 6.5 % (ref 4.6–6.5)

## 2021-09-06 LAB — COMPREHENSIVE METABOLIC PANEL
ALT: 65 U/L — ABNORMAL HIGH (ref 0–53)
AST: 49 U/L — ABNORMAL HIGH (ref 0–37)
Albumin: 4.4 g/dL (ref 3.5–5.2)
Alkaline Phosphatase: 115 U/L (ref 39–117)
BUN: 19 mg/dL (ref 6–23)
CO2: 21 mEq/L (ref 19–32)
Calcium: 9.2 mg/dL (ref 8.4–10.5)
Chloride: 102 mEq/L (ref 96–112)
Creatinine, Ser: 0.99 mg/dL (ref 0.40–1.50)
GFR: 85.94 mL/min (ref >60.00)
Glucose, Bld: 100 mg/dL — ABNORMAL HIGH (ref 70–99)
Potassium: 4.4 mEq/L (ref 3.5–5.1)
Sodium: 136 mEq/L (ref 135–145)
Total Bilirubin: 1.2 mg/dL (ref 0.2–1.2)
Total Protein: 7.2 g/dL (ref 6.0–8.3)

## 2021-09-06 LAB — MICROALBUMIN / CREATININE URINE RATIO
Creatinine,U: 321.5 mg/dL
Microalb Creat Ratio: 1.4 mg/g (ref 0.0–30.0)
Microalb, Ur: 4.3 mg/dL — ABNORMAL HIGH (ref 0.0–1.9)

## 2021-09-06 LAB — LDL CHOLESTEROL, DIRECT: Direct LDL: 113 mg/dL

## 2021-09-06 NOTE — Progress Notes (Signed)
Phone [phone] In person visit   Subjective:   Andrew Li is a 56 y.o. year old very pleasant male patient who presents for/with See problem oriented charting Chief Complaint  Patient presents with   Follow-up   Hypertension   Past Medical History-  Patient Active Problem List   Diagnosis Date Noted   Diabetes mellitus, type II ([place]) 03/22/2021    Priority: High   Normal coronary arteries 04/20/2016    Priority: High   History of non-ST elevation myocardial infarction (NSTEMI) 04/05/2016    Priority: High   Diplopia 11/17/2014    Priority: High   Vasovagal syncope 09/17/2013    Priority: High   Hyperlipidemia, unspecified 02/20/2019    Priority: Medium    Essential hypertension 09/17/2013    Priority: Medium    Elevated LFTs 02/15/2017    Priority: Low   Former smoker 02/15/2017    Priority: Low   Abnormal CT scan of lung 02/15/2017    Priority: Low   Nephrolithiasis 01/25/2017    Priority: Low   Ocular migraine 11/17/2014    Priority: Low    Medications- reviewed and updated Current Outpatient Medications  Medication Sig Dispense Refill   aspirin 325 MG tablet Take 325 mg by mouth every 6 (six) hours as needed for headache.      b complex vitamins tablet Take 1 tablet by mouth daily.     carvedilol (COREG) 12.5 MG tablet Take 1 tablet (12.5 mg total) by mouth 2 (two) times daily with a meal. 180 tablet 3   Cholecalciferol (VITAMIN D3) 125 MCG (5000 UT) TABS Take 5,000 Units by mouth daily.      Citrulline 1 g PACK Take by mouth.     Coenzyme Q10 (CO Q 10 PO) Take 60 mg by mouth daily.      COPPER PO Take 2 mg by mouth daily.      diltiazem (CARDIZEM CD) 300 MG 24 hr capsule TAKE 1 CAPSULE BY MOUTH EVERY DAY 90 capsule 3   Melatonin 5 MG CAPS Take by mouth.     metFORMIN (GLUCOPHAGE) 500 MG tablet Take 1 tablet (500 mg total) by mouth daily with breakfast. 90 tablet 3   nitroGLYCERIN (NITROSTAT) 0.4 MG SL tablet Place 1 tablet (0.4 mg total) under the  tongue every 5 (five) minutes x 3 doses as needed for chest pain. 25 tablet 12   telmisartan (MICARDIS) 80 MG tablet TAKE 1 TABLET BY MOUTH EVERY DAY 90 tablet 3   vitamin C (ASCORBIC ACID) 500 MG tablet Take 500 mg by mouth daily.     vitamin E 200 UNIT capsule Take 200 Units by mouth daily.     No current facility-administered medications for this visit.     Objective:  BP 120/74   Pulse 83   Temp 98.6 F (37 C)   Ht _0  (1.803 m)   Wt 221 lb 3.2 oz (100.3 kg)   SpO2 94%   BMI 30.85 kg/m  Gen: NAD, resting comfortably CV: RRR no murmurs rubs or gallops Lungs: CTAB no crackles, wheeze, rhonchi Ext: no edema Skin: warm, dry  Diabetic Foot Exam - Simple   Simple Foot Form Diabetic Foot exam was performed with the following findings: Yes 09/06/2021  9:46 AM  Visual Inspection No deformities, no ulcerations, no other skin breakdown bilaterally: Yes Sensation Testing Intact to touch and monofilament testing bilaterally: Yes Pulse Check Posterior Tibialis and Dorsalis pulse intact bilaterally: Yes Comments  Assessment and Plan   # Diabetes-diagnosis February 2023 with A1c 7.3 S: Medication:Metformin 500 mg daily- hard first month but body has gotten used to it- looser stools CBGs- has glucometer Exercise and diet- keto off and on - down 10 lbs- if does well with this sugars can get down to 95- highest in am up to 114-120 A/P: Hopefully improved-update A1c with labs today.  For now continue current medication  #hypertension  S: medication: Carvedilol 12.5 g twice daily, diltiazem 300 mg extended release, telmisartan 80 mg daily Home readings #s: looks good at home as well BP Readings from Last 3 Encounters:  09/06/21 120/74  03/08/21 (!) 160/90  09/08/20 115/75   A/P:  Controlled. Continue current medications.     #History of NSTEMI-no significant CAD March 2018 catheterization-thought related to excessive hypertension or vasospasm #hyperlipidemia S:  Medication:Has declined statin in the past both here and at cardiology.  He is compliant with aspirin 325 mg.  Has nitroglycerin on hand but not needing  Lab Results  Component Value Date   CHOL 183 07/08/2020   HDL 35.10 (L) 07/08/2020   LDLCALC 104 (H) 08/22/2018   LDLDIRECT 128.0 07/08/2020   TRIG 207.0 (H) 07/08/2020   CHOLHDL 5 07/08/2020    A/P: Thankfully catheterization without significant CAD in past but discussed with diabetes increased cardiac risk-discussed option even once a week statin-update lipid panel today - consider rosuvastatin 10 mg once a week- he wants to update ct cardiac scoring as well    #Mild LFT elevation-update LFTs with labs-distribution appears consistent with fatty liver-discussed importance of weight loss/healthy eating/regular exercise-congratulated on 10 pound weight loss from last visit- hoping for improvement- limited by ankle recently on exercise- hoping since better can get going again. Alcohol in range of 10-20 a week- encouraged cut 5-10 range for liver health    Latest Ref Rng & Units 03/22/2021    9:30 AM 07/08/2020    2:06 PM 08/22/2018    4:28 PM  Hepatic Function  Total Protein 6.0 - 8.3 g/dL 6.8  7.2  7.0   Albumin 3.5 - 5.2 g/dL 4.0  4.1  4.3   AST 0 - 37 U/L 43  44  26   ALT 0 - 53 U/L 58  61  34   Alk Phosphatase 39 - 117 U/L 110  115  143   Total Bilirubin 0.2 - 1.2 mg/dL 0.8  1.3  0.8       # Left ankle pain- was ongoing after tweaked something over a month ago but healed 2 weeks ago- if worsens again will let me know- could consider podiatry vs sports medicine  Recommended follow up: Return in about 6 months (around 03/09/2022) for physical or sooner if needed.Schedule b4 you leave.   Lab/Order associations: fasting after midnight   ICD-10-CM   1. Type 2 diabetes mellitus without complication, without long-term current use of insulin (HCC)  E11.9     2. Essential hypertension  I10     3. Hyperlipidemia, unspecified hyperlipidemia  type  E78.5       No orders of the defined types were placed in this encounter.   Return precautions advised.  [name], MD

## 2021-09-06 NOTE — Patient Instructions (Addendum)
Health Maintenance Due  Topic Date Due   OPHTHALMOLOGY EXAM -with your next eye exam please let your doctor know that you now have diabetes Never done   Fecal DNA (Cologuard) -please let us know if you have not received your Cologuard within 3 weeks-please complete after you receive  06/08/2021   Flu shot- we should have these available within a month or two but please let us know if you get at outside pharmacy -Could consider updating [place] vaccination in October when new vaccination released   Please stop by lab before you go If you have mychart- we will send your results within 3 business days of [place] receiving them.  If you do not have mychart- we will call you about results within 5 business days of [place] receiving them.  *please also note that you will see labs on mychart as soon as they post. I will later go in and write notes on them- will say "notes from Dr. Yong Channel"   Recommended follow up: Return in about 6 months (around 03/09/2022) for physical or sooner if needed.Schedule b4 you leave.

## 2021-09-11 ENCOUNTER — Other Ambulatory Visit: Payer: Self-pay | Admitting: Family Medicine

## 2021-09-15 ENCOUNTER — Other Ambulatory Visit: Payer: Self-pay | Admitting: Family Medicine

## 2021-10-06 DIAGNOSIS — Z1211 Encounter for screening for malignant neoplasm of colon: Secondary | ICD-10-CM | POA: Diagnosis not present

## 2021-10-13 LAB — COLOGUARD: COLOGUARD: NEGATIVE

## 2021-10-24 ENCOUNTER — Encounter: Payer: Self-pay | Admitting: *Deleted

## 2021-10-26 ENCOUNTER — Ambulatory Visit (INDEPENDENT_AMBULATORY_CARE_PROVIDER_SITE_OTHER): Payer: BC Managed Care – PPO | Admitting: Orthopaedic Surgery

## 2021-10-26 ENCOUNTER — Ambulatory Visit (INDEPENDENT_AMBULATORY_CARE_PROVIDER_SITE_OTHER): Payer: BC Managed Care – PPO

## 2021-10-26 DIAGNOSIS — M25571 Pain in right ankle and joints of right foot: Secondary | ICD-10-CM | POA: Diagnosis not present
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
## 2021-12-02 DIAGNOSIS — M76822 Posterior tibial tendinitis, left leg: Secondary | ICD-10-CM | POA: Diagnosis not present

## 2021-12-02 DIAGNOSIS — M76821 Posterior tibial tendinitis, right leg: Secondary | ICD-10-CM | POA: Diagnosis not present

## 2022-01-12 ENCOUNTER — Encounter: Payer: Self-pay | Admitting: *Deleted

## 2022-01-17 ENCOUNTER — Other Ambulatory Visit: Payer: Self-pay | Admitting: Family Medicine

## 2022-03-16 ENCOUNTER — Other Ambulatory Visit (HOSPITAL_COMMUNITY)
Admission: RE | Admit: 2022-03-16 | Discharge: 2022-03-16 | Disposition: A | Discharge status: Home / Self Care | Payer: BC Managed Care – PPO | Source: Ambulatory Visit | Attending: Family Medicine | Admitting: Family Medicine

## 2022-03-16 ENCOUNTER — Encounter: Payer: Self-pay | Admitting: Family Medicine

## 2022-03-16 ENCOUNTER — Other Ambulatory Visit: Payer: Self-pay

## 2022-03-16 ENCOUNTER — Ambulatory Visit (INDEPENDENT_AMBULATORY_CARE_PROVIDER_SITE_OTHER): Payer: BC Managed Care – PPO | Admitting: Family Medicine

## 2022-03-16 VITALS — BP 140/80 | HR 77 | Temp 97.6°F | Ht 71.0 in | Wt 213.8 lb

## 2022-03-16 DIAGNOSIS — Z118 Encounter for screening for other infectious and parasitic diseases: Secondary | ICD-10-CM

## 2022-03-16 DIAGNOSIS — Z113 Encounter for screening for infections with a predominantly sexual mode of transmission: Secondary | ICD-10-CM | POA: Insufficient documentation

## 2022-03-16 DIAGNOSIS — E785 Hyperlipidemia, unspecified: Secondary | ICD-10-CM

## 2022-03-16 DIAGNOSIS — Z125 Encounter for screening for malignant neoplasm of prostate: Secondary | ICD-10-CM | POA: Diagnosis not present

## 2022-03-16 DIAGNOSIS — E119 Type 2 diabetes mellitus without complications: Secondary | ICD-10-CM | POA: Diagnosis not present

## 2022-03-16 DIAGNOSIS — Z23 Encounter for immunization: Secondary | ICD-10-CM

## 2022-03-16 DIAGNOSIS — I10 Essential (primary) hypertension: Secondary | ICD-10-CM

## 2022-03-16 DIAGNOSIS — Z0000 Encounter for general adult medical examination without abnormal findings: Secondary | ICD-10-CM | POA: Diagnosis not present

## 2022-03-16 DIAGNOSIS — Z114 Encounter for screening for human immunodeficiency virus [HIV]: Secondary | ICD-10-CM

## 2022-03-16 LAB — COMPREHENSIVE METABOLIC PANEL
ALT: 28 U/L (ref 0–53)
AST: 20 U/L (ref 0–37)
Albumin: 4.1 g/dL (ref 3.5–5.2)
Alkaline Phosphatase: 106 U/L (ref 39–117)
BUN: 18 mg/dL (ref 6–23)
CO2: 25 mEq/L (ref 19–32)
Calcium: 9.3 mg/dL (ref 8.4–10.5)
Chloride: 104 mEq/L (ref 96–112)
Creatinine, Ser: 0.95 mg/dL (ref 0.40–1.50)
GFR: 89.97 mL/min (ref >60.00)
Glucose, Bld: 124 mg/dL — ABNORMAL HIGH (ref 70–99)
Potassium: 3.9 mEq/L (ref 3.5–5.1)
Sodium: 138 mEq/L (ref 135–145)
Total Bilirubin: 0.6 mg/dL (ref 0.2–1.2)
Total Protein: 6.9 g/dL (ref 6.0–8.3)

## 2022-03-16 LAB — CBC WITH DIFFERENTIAL/PLATELET
Basophils Absolute: 0.1 10*3/uL (ref 0.0–0.1)
Basophils Relative: 0.8 % (ref 0.0–3.0)
Eosinophils Absolute: 0.2 10*3/uL (ref 0.0–0.7)
Eosinophils Relative: 2.3 % (ref 0.0–5.0)
HCT: 46.4 % (ref 39.0–52.0)
Hemoglobin: 16 g/dL (ref 13.0–17.0)
Lymphocytes Relative: 37.8 % (ref 12.0–46.0)
Lymphs Abs: 2.9 10*3/uL (ref 0.7–4.0)
MCHC: 34.5 g/dL (ref 30.0–36.0)
MCV: 91.6 fl (ref 78.0–100.0)
Monocytes Absolute: 0.5 10*3/uL (ref 0.1–1.0)
Monocytes Relative: 6.8 % (ref 3.0–12.0)
Neutro Abs: 4 10*3/uL (ref 1.4–7.7)
Neutrophils Relative %: 52.3 % (ref 43.0–77.0)
Platelets: 264 10*3/uL (ref 150.0–400.0)
RBC: 5.07 Mil/uL (ref 4.22–5.81)
RDW: 13 % (ref 11.5–15.5)
WBC: 7.7 10*3/uL (ref 4.0–10.5)

## 2022-03-16 LAB — LIPID PANEL
Cholesterol: 202 mg/dL — ABNORMAL HIGH (ref 0–200)
HDL: 33.1 mg/dL — ABNORMAL LOW (ref >39.00)
NonHDL: 169.24
Total CHOL/HDL Ratio: 6
Triglycerides: 244 mg/dL — ABNORMAL HIGH (ref 0.0–149.0)
VLDL: 48.8 mg/dL — ABNORMAL HIGH (ref 0.0–40.0)

## 2022-03-16 LAB — LDL CHOLESTEROL, DIRECT: Direct LDL: 114 mg/dL

## 2022-03-16 LAB — HEMOGLOBIN A1C: Hgb A1c MFr Bld: 6.7 % — ABNORMAL HIGH (ref 4.6–6.5)

## 2022-03-16 LAB — PSA: PSA: 1.51 ng/mL (ref 0.10–4.00)

## 2022-03-16 NOTE — Patient Instructions (Addendum)
Get diabetic eye exam scheduled.  blood pressure just hair above goal- has not done home monitoring lately- will update me in 1-2 weeks by mychart but no change in meds today  We will call you within two weeks about your referral to CT calcium scoring through [facility].  Their phone number is [phone].  Please call them if you have not heard in 1-2 weeks   Please stop by lab before you go If you have mychart- we will send your results within 3 business days of [place] receiving them.  If you do not have mychart- we will call you about results within 5 business days of [place] receiving them.  *please also note that you will see labs on mychart as soon as they post. I will later go in and write notes on them- will say "notes from Dr. Yong Channel"   Recommended follow up: Return in about 6 months (around 09/14/2022) for followup or sooner if needed.Schedule b4 you leave.  As long as a1c under 7
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
## 2022-03-17 LAB — URINE CYTOLOGY ANCILLARY ONLY
Chlamydia: NEGATIVE
Comment: NEGATIVE
Comment: NEGATIVE
Comment: NORMAL
Neisseria Gonorrhea: NEGATIVE
Trichomonas: NEGATIVE

## 2022-03-17 LAB — HIV ANTIBODY (ROUTINE TESTING W REFLEX): HIV 1&2 Ab, 4th Generation: NONREACTIVE

## 2022-03-17 LAB — RPR: RPR Ser Ql: NONREACTIVE

## 2022-03-18 ENCOUNTER — Other Ambulatory Visit: Payer: Self-pay | Admitting: Family Medicine

## 2022-05-04 ENCOUNTER — Ambulatory Visit
Admission: RE | Admit: 2022-05-04 | Discharge: 2022-05-04 | Disposition: A | Discharge status: Home / Self Care | Payer: No Typology Code available for payment source | Source: Ambulatory Visit | Attending: Family Medicine | Admitting: Family Medicine

## 2022-05-04 DIAGNOSIS — E785 Hyperlipidemia, unspecified: Secondary | ICD-10-CM

## 2022-05-05 ENCOUNTER — Encounter: Payer: Self-pay | Admitting: Family Medicine

## 2022-05-05 DIAGNOSIS — I700 Atherosclerosis of aorta: Secondary | ICD-10-CM | POA: Insufficient documentation

## 2022-05-05 DIAGNOSIS — R931 Abnormal findings on diagnostic imaging of heart and coronary circulation: Secondary | ICD-10-CM | POA: Insufficient documentation

## 2022-05-08 ENCOUNTER — Encounter: Payer: Self-pay | Admitting: Family Medicine

## 2022-06-15 DIAGNOSIS — D2272 Melanocytic nevi of left lower limb, including hip: Secondary | ICD-10-CM | POA: Diagnosis not present

## 2022-06-15 DIAGNOSIS — L814 Other melanin hyperpigmentation: Secondary | ICD-10-CM | POA: Diagnosis not present

## 2022-06-15 DIAGNOSIS — D485 Neoplasm of uncertain behavior of skin: Secondary | ICD-10-CM | POA: Diagnosis not present

## 2022-06-15 DIAGNOSIS — L821 Other seborrheic keratosis: Secondary | ICD-10-CM | POA: Diagnosis not present

## 2022-06-15 DIAGNOSIS — D2271 Melanocytic nevi of right lower limb, including hip: Secondary | ICD-10-CM | POA: Diagnosis not present

## 2022-06-15 DIAGNOSIS — D225 Melanocytic nevi of trunk: Secondary | ICD-10-CM | POA: Diagnosis not present

## 2022-07-17 ENCOUNTER — Encounter: Payer: Self-pay | Admitting: Cardiovascular Disease

## 2022-07-17 ENCOUNTER — Ambulatory Visit: Payer: BC Managed Care – PPO | Attending: Cardiovascular Disease | Admitting: Cardiovascular Disease

## 2022-07-17 VITALS — BP 131/81 | HR 68 | Ht 71.0 in | Wt 209.0 lb

## 2022-07-17 DIAGNOSIS — E782 Mixed hyperlipidemia: Secondary | ICD-10-CM | POA: Diagnosis not present

## 2022-07-17 DIAGNOSIS — R931 Abnormal findings on diagnostic imaging of heart and coronary circulation: Secondary | ICD-10-CM | POA: Diagnosis not present

## 2022-07-17 DIAGNOSIS — Z7984 Long term (current) use of oral hypoglycemic drugs: Secondary | ICD-10-CM

## 2022-07-17 DIAGNOSIS — I10 Essential (primary) hypertension: Secondary | ICD-10-CM

## 2022-07-17 DIAGNOSIS — E119 Type 2 diabetes mellitus without complications: Secondary | ICD-10-CM | POA: Diagnosis not present

## 2022-07-17 DIAGNOSIS — E785 Hyperlipidemia, unspecified: Secondary | ICD-10-CM

## 2022-07-17 MED ORDER — ROSUVASTATIN CALCIUM 10 MG PO TABS: 10.0000 mg | ORAL_TABLET | Freq: Every day | ORAL | 3 refills | Status: AC
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
## 2022-07-17 NOTE — Patient Instructions (Signed)
Medication Instructions:  START Rosuvastatin 10 mg every evening *If you need a refill on your cardiac medications before your next appointment, please call your pharmacy*   Lab Work: Fasting Lipid Panel- Please return for Blood Work in 3 months. No appointment needed, lab here at the office is open Monday-Friday from 8AM to 4PM.   If you have labs (blood work) drawn today and your tests are completely normal, you will receive your results only by: MyChart Message (if you have MyChart) OR A paper copy in the mail If you have any lab test that is abnormal or we need to change your treatment, we will call you to review the results.   Testing/Procedures:                       Patient Instructions for Stress Test  Medication instructions:   Do NOT take your Carvedilol on the morning of your procedure  Do not eat, drink or use tobacco products four hours prior to the test.  Water is ok.  3.  Dress prepared to exercise in a comfortable, two piece clothing outfit and walking shoes.  4.  Bring any current prescription medications with you the day of the test.  5.  Notify the office 24 hours in advance if you cannot keep this appointment.  6.  If you have any questions, please call [phone].    Follow-Up: At [facility], you and your health needs are our priority.  As part of our continuing mission to provide you with exceptional heart care, we have created designated Provider Care Teams.  These Care Teams include your primary Cardiologist (physician) and Advanced Practice Providers (APPs -  Physician Assistants and Nurse Practitioners) who all work together to provide you with the care you need, when you need it.  We recommend signing up for the patient portal called "MyChart".  Sign up information is provided on this After Visit Summary.  MyChart is used to connect with patients for Virtual Visits (Telemedicine).  Patients are able to view lab/test results, encounter notes,  upcoming appointments, etc.  Non-urgent messages can be sent to your provider as well.   To learn more about what you can do with MyChart, go to [email or web address].    Your next appointment:   1 year(s)  Provider:   Dr Royann Shivers

## 2022-07-22 ENCOUNTER — Encounter: Payer: Self-pay | Admitting: Cardiovascular Disease

## 2022-08-01 ENCOUNTER — Telehealth: Payer: Self-pay

## 2022-08-01 NOTE — Telephone Encounter (Signed)
Detailed instructions left on the patient's answering machine. Asked to call back with any questions. S.Harwood Nall EMTP/CCT 

## 2022-08-08 ENCOUNTER — Ambulatory Visit (HOSPITAL_COMMUNITY): Payer: BC Managed Care – PPO

## 2022-08-18 ENCOUNTER — Encounter (HOSPITAL_COMMUNITY): Payer: BC Managed Care – PPO

## 2022-08-30 ENCOUNTER — Telehealth (HOSPITAL_COMMUNITY): Payer: Self-pay | Admitting: *Deleted

## 2022-08-30 ENCOUNTER — Encounter (INDEPENDENT_AMBULATORY_CARE_PROVIDER_SITE_OTHER): Payer: Self-pay

## 2022-08-30 NOTE — Telephone Encounter (Signed)
Left detailed message on cell phone about a treadmill test scheduled for 09/01/22 at 11:00.

## 2022-08-31 ENCOUNTER — Ambulatory Visit (HOSPITAL_COMMUNITY): Payer: BC Managed Care – PPO

## 2022-09-01 ENCOUNTER — Encounter: Payer: Self-pay | Admitting: Cardiovascular Disease

## 2022-09-01 ENCOUNTER — Ambulatory Visit (HOSPITAL_COMMUNITY): Payer: BC Managed Care – PPO | Attending: Cardiovascular Disease

## 2022-09-01 DIAGNOSIS — R931 Abnormal findings on diagnostic imaging of heart and coronary circulation: Secondary | ICD-10-CM | POA: Insufficient documentation

## 2022-09-01 LAB — EXERCISE TOLERANCE TEST
Estimated workload: 7
Exercise duration (min): 6 min
Exercise duration (sec): 1 s
MPHR: 164 {beats}/min
Peak HR: 148 {beats}/min
Percent HR: 90 %
RPE: 18
Rest HR: 76 {beats}/min

## 2022-09-13 ENCOUNTER — Ambulatory Visit: Payer: BC Managed Care – PPO | Admitting: Family Medicine

## 2022-09-13 ENCOUNTER — Encounter: Payer: Self-pay | Admitting: Family Medicine

## 2022-09-13 VITALS — BP 122/82 | HR 59 | Temp 97.5°F | Ht 71.0 in | Wt 211.6 lb

## 2022-09-13 DIAGNOSIS — E785 Hyperlipidemia, unspecified: Secondary | ICD-10-CM

## 2022-09-13 DIAGNOSIS — E119 Type 2 diabetes mellitus without complications: Secondary | ICD-10-CM

## 2022-09-13 DIAGNOSIS — I10 Essential (primary) hypertension: Secondary | ICD-10-CM

## 2022-09-13 DIAGNOSIS — I700 Atherosclerosis of aorta: Secondary | ICD-10-CM

## 2022-09-13 DIAGNOSIS — Z7984 Long term (current) use of oral hypoglycemic drugs: Secondary | ICD-10-CM

## 2022-09-13 LAB — COMPREHENSIVE METABOLIC PANEL
ALT: 18 U/L (ref 0–53)
AST: 20 U/L (ref 0–37)
Albumin: 4.3 g/dL (ref 3.5–5.2)
Alkaline Phosphatase: 89 U/L (ref 39–117)
BUN: 19 mg/dL (ref 6–23)
CO2: 25 mEq/L (ref 19–32)
Calcium: 9.4 mg/dL (ref 8.4–10.5)
Chloride: 105 mEq/L (ref 96–112)
Creatinine, Ser: 1.07 mg/dL (ref 0.40–1.50)
GFR: 77.73 mL/min (ref >60.00)
Glucose, Bld: 124 mg/dL — ABNORMAL HIGH (ref 70–99)
Potassium: 4 mEq/L (ref 3.5–5.1)
Sodium: 139 mEq/L (ref 135–145)
Total Bilirubin: 0.7 mg/dL (ref 0.2–1.2)
Total Protein: 7.1 g/dL (ref 6.0–8.3)

## 2022-09-13 LAB — LIPID PANEL
Cholesterol: 117 mg/dL (ref 0–200)
HDL: 37 mg/dL — ABNORMAL LOW (ref >39.00)
LDL Cholesterol: 54 mg/dL (ref 0–99)
NonHDL: 79.74
Total CHOL/HDL Ratio: 3
Triglycerides: 128 mg/dL (ref 0.0–149.0)
VLDL: 25.6 mg/dL (ref 0.0–40.0)

## 2022-09-13 LAB — HEMOGLOBIN A1C: Hgb A1c MFr Bld: 6 % (ref 4.6–6.5)

## 2022-09-13 LAB — MICROALBUMIN / CREATININE URINE RATIO
Creatinine,U: 243.2 mg/dL
Microalb Creat Ratio: 0.6 mg/g (ref 0.0–30.0)
Microalb, Ur: 1.6 mg/dL (ref 0.0–1.9)

## 2022-09-13 MED ORDER — SILDENAFIL CITRATE 100 MG PO TABS: 100.0000 mg | ORAL_TABLET | Freq: Every day | ORAL | 11 refills | Status: DC | PRN

## 2022-09-13 NOTE — Progress Notes (Signed)
Phone [phone] In person visit   Subjective:   Andrew Li is a 57 y.o. year old very pleasant male patient who presents for/with See problem oriented charting Chief Complaint  Patient presents with   Medical Management of Chronic Issues   Hypertension   Diabetes    Past Medical History-  Patient Active Problem List   Diagnosis Date Noted   Agatston coronary artery calcium score between 200 and 399 05/05/2022    Priority: High   Diabetes mellitus, type II (HCC) 03/22/2021    Priority: High   History of non-ST elevation myocardial infarction (NSTEMI) 04/05/2016    Priority: High   Diplopia 11/17/2014    Priority: High   Vasovagal syncope 09/17/2013    Priority: High   Aortic atherosclerosis (HCC) 05/05/2022    Priority: Medium    Hyperlipidemia, unspecified 02/20/2019    Priority: Medium    Essential hypertension 09/17/2013    Priority: Medium    Elevated LFTs 02/15/2017    Priority: Low   Former smoker 02/15/2017    Priority: Low   Abnormal CT scan of lung 02/15/2017    Priority: Low   Nephrolithiasis 01/25/2017    Priority: Low   Normal coronary arteries 04/20/2016    Priority: Low   Ocular migraine 11/17/2014    Priority: Low    Medications- reviewed and updated Current Outpatient Medications  Medication Sig Dispense Refill   aspirin 325 MG tablet Take 325 mg by mouth every 6 (six) hours as needed for headache.      b complex vitamins tablet Take 1 tablet by mouth daily.     carvedilol (COREG) 12.5 MG tablet TAKE 1 TABLET(12.5 MG) BY MOUTH TWICE DAILY WITH A MEAL 180 tablet 3   Cholecalciferol (VITAMIN D3) 125 MCG (5000 UT) TABS Take 5,000 Units by mouth daily.      Citrulline 1 g PACK Take by mouth.     Coenzyme Q10 (CO Q 10 PO) Take 60 mg by mouth daily.      COPPER PO Take 2 mg by mouth daily.      diltiazem (CARDIZEM CD) 300 MG 24 hr capsule TAKE 1 CAPSULE BY MOUTH EVERY DAY 90 capsule 3   metFORMIN (GLUCOPHAGE) 500 MG tablet TAKE 1  TABLET(500 MG) BY MOUTH DAILY WITH BREAKFAST 90 tablet 3   rosuvastatin (CRESTOR) 10 MG tablet Take 1 tablet (10 mg total) by mouth daily. 90 tablet 3   telmisartan (MICARDIS) 80 MG tablet TAKE 1 TABLET BY MOUTH EVERY DAY 90 tablet 3   vitamin C (ASCORBIC ACID) 500 MG tablet Take 500 mg by mouth daily.     vitamin E 200 UNIT capsule Take 200 Units by mouth daily.     nitroGLYCERIN (NITROSTAT) 0.4 MG SL tablet Place 1 tablet (0.4 mg total) under the tongue every 5 (five) minutes x 3 doses as needed for chest pain. (Patient not taking: Reported on 09/13/2022) 25 tablet 12   No current facility-administered medications for this visit.     Objective:  BP 122/82   Pulse (!) 59   Temp (!) 97.5 F (36.4 C)   Ht 5\' 11"  (1.803 m)   Wt 211 lb 9.6 oz (96 kg)   SpO2 96%   BMI 29.51 kg/m  Gen: NAD, resting comfortably CV: RRR no murmurs rubs or gallops Lungs: CTAB no crackles, wheeze, rhonchi Ext: no edema Skin: warm, dry    Assessment and Plan   # Social update-tough break-up November 2023-there is a chance  that she could have had his child- she has not contacted him though. Dating woman for about a month now.   # Diabetes-diagnosis February 2023 with A1c 7.3 S: Medication:Metformin 500 mg daily Lab Results  Component Value Date   HGBA1C 6.7 (H) 03/16/2022   HGBA1C 6.5 09/06/2021   HGBA1C 7.3 (H) 03/22/2021  A/P: hopefully stable- update a1c today. Continue current meds for now   #hypertension  S: medication: Carvedilol 12.5 g twice daily, diltiazem 300 mg extended release, telmisartan 80 mg daily Home readings #s: checks at work- worst 150/90 but usually around 130/80 -Per last cardiology note March 2021 they recommended to avoid diuretics due to neurally mediated syncope in the past at the gym plus kidney stone history -Pheochromocytoma testing negative -Cardiology prefers Bystolic or carvedilol over metoprolol BP Readings from Last 3 Encounters:  09/13/22 122/82  07/22/22 131/81   03/16/22 (!) 140/80  A/P: stable- continue current medicines - at goal per jnc8   #History of NSTEMI-no significant CAD March 2018 catheterization-thought related to excessive hypertension or vasospasm #hyperlipidemia S: Medication:Rosuvastatin 10 mg starting in 2024-has declined statin in the past both here and at cardiology.  He is compliant with aspirin 325 mg.  Has nitroglycerin on hand but not needing  -perhaps mild brain fog- he may try to tweak diet- if worsens should let us know -got rowing machine repaired- plans to restart Lab Results  Component Value Date   CHOL 202 (H) 03/16/2022   HDL 33.10 (L) 03/16/2022   LDLCALC 104 (H) 08/22/2018   LDLDIRECT 114.0 03/16/2022   TRIG 244.0 (H) 03/16/2022   CHOLHDL 6 03/16/2022  A/P: nonobstructive coronary artery disease asymptomatic - continue current medications. Thankfully stress test reassuring other than blood pressure response but was off medicines for his test Lipids hopefully improved- update with labs today Aortic atherosclerosis (presumed stable)- LDL goal ideally <70 - hopefully at goal  #Ed- intermittent issues- will trial sildenafil but honestly I suspect if uses once or twice may not even need long term. Just use half tablet -DO NOT USE sildenafil or nitroglycerin within 24 hours of each other- I feel its ok to still send this since you are not using the nitroglycerin at all  #alcohol- quit drinking in January other than rare social- which is great especially with prior elevated LFTs and now having to be on statin Lab Results  Component Value Date   ALT 28 03/16/2022   AST 20 03/16/2022   ALKPHOS 106 03/16/2022   BILITOT 0.6 03/16/2022   Recommended follow up: Return in about 6 months (around 03/16/2023) for physical or sooner if needed.Schedule b4 you leave.  Lab/Order associations: fasting   ICD-10-CM   1. Type 2 diabetes mellitus without complication, without long-term current use of insulin (HCC)  E11.9     2.  Essential hypertension  I10     3. Hyperlipidemia, unspecified hyperlipidemia type  E78.5     4. Aortic atherosclerosis (HCC) Chronic I70.0       No orders of the defined types were placed in this encounter.   Return precautions advised.  Tana Conch, MD

## 2022-09-13 NOTE — Patient Instructions (Addendum)
Get diabetic eye exam scheduled.  Let us know if you get a flu shot this fall.  Please stop by lab before you go If you have mychart- we will send your results within 3 business days of [place] receiving them.  If you do not have mychart- we will call you about results within 5 business days of [place] receiving them.  *please also note that you will see labs on mychart as soon as they post. I will later go in and write notes on them- will say "notes from Dr. Durene Cal"   #Ed- intermittent issues- will trial sildenafil but honestly I suspect if uses once or twice may not even need long term. Just use half tablet -DO NOT USE sildenafil or nitroglycerin within 24 hours of each other- I feel its ok to still send this since you are not using the nitroglycerin at all -stop if any chest pain, shortness of breath or lightheadedness on medicine  Recommended follow up: Return in about 6 months (around 03/16/2023) for physical or sooner if needed.Schedule b4 you leave.

## 2022-10-02 ENCOUNTER — Other Ambulatory Visit: Payer: Self-pay | Admitting: Family Medicine

## 2022-10-17 DIAGNOSIS — E782 Mixed hyperlipidemia: Secondary | ICD-10-CM | POA: Diagnosis not present

## 2022-10-23 LAB — LIPID PANEL
Chol/HDL Ratio: 2.9 ratio (ref 0.0–5.0)
Cholesterol, Total: 99 mg/dL — ABNORMAL LOW (ref 100–199)
HDL: 34 mg/dL — ABNORMAL LOW (ref >39)
LDL Chol Calc (NIH): 41 mg/dL (ref 0–99)
Triglycerides: 135 mg/dL (ref 0–149)
VLDL Cholesterol Cal: 24 mg/dL (ref 5–40)

## 2023-01-17 DIAGNOSIS — L814 Other melanin hyperpigmentation: Secondary | ICD-10-CM | POA: Diagnosis not present

## 2023-01-17 DIAGNOSIS — L821 Other seborrheic keratosis: Secondary | ICD-10-CM | POA: Diagnosis not present

## 2023-01-17 DIAGNOSIS — L570 Actinic keratosis: Secondary | ICD-10-CM | POA: Diagnosis not present

## 2023-01-17 DIAGNOSIS — D2271 Melanocytic nevi of right lower limb, including hip: Secondary | ICD-10-CM | POA: Diagnosis not present

## 2023-01-17 DIAGNOSIS — D225 Melanocytic nevi of trunk: Secondary | ICD-10-CM | POA: Diagnosis not present

## 2023-04-03 ENCOUNTER — Encounter: Payer: Self-pay | Admitting: Family Medicine

## 2023-04-03 ENCOUNTER — Ambulatory Visit (INDEPENDENT_AMBULATORY_CARE_PROVIDER_SITE_OTHER): Payer: BC Managed Care – PPO | Admitting: Family Medicine

## 2023-04-03 VITALS — BP 130/72 | HR 68 | Temp 97.3°F | Ht 71.0 in | Wt 214.8 lb

## 2023-04-03 DIAGNOSIS — Z7984 Long term (current) use of oral hypoglycemic drugs: Secondary | ICD-10-CM | POA: Diagnosis not present

## 2023-04-03 DIAGNOSIS — Z23 Encounter for immunization: Secondary | ICD-10-CM

## 2023-04-03 DIAGNOSIS — I10 Essential (primary) hypertension: Secondary | ICD-10-CM | POA: Diagnosis not present

## 2023-04-03 DIAGNOSIS — Z0000 Encounter for general adult medical examination without abnormal findings: Secondary | ICD-10-CM

## 2023-04-03 DIAGNOSIS — E785 Hyperlipidemia, unspecified: Secondary | ICD-10-CM

## 2023-04-03 DIAGNOSIS — Z125 Encounter for screening for malignant neoplasm of prostate: Secondary | ICD-10-CM

## 2023-04-03 DIAGNOSIS — Z87891 Personal history of nicotine dependence: Secondary | ICD-10-CM | POA: Diagnosis not present

## 2023-04-03 DIAGNOSIS — I700 Atherosclerosis of aorta: Secondary | ICD-10-CM

## 2023-04-03 DIAGNOSIS — E119 Type 2 diabetes mellitus without complications: Secondary | ICD-10-CM

## 2023-04-03 LAB — COMPREHENSIVE METABOLIC PANEL
ALT: 16 U/L (ref 0–53)
AST: 16 U/L (ref 0–37)
Albumin: 4.3 g/dL (ref 3.5–5.2)
Alkaline Phosphatase: 100 U/L (ref 39–117)
BUN: 18 mg/dL (ref 6–23)
CO2: 25 meq/L (ref 19–32)
Calcium: 9.6 mg/dL (ref 8.4–10.5)
Chloride: 106 meq/L (ref 96–112)
Creatinine, Ser: 0.91 mg/dL (ref 0.40–1.50)
GFR: 94.04 mL/min (ref >60.00)
Glucose, Bld: 141 mg/dL — ABNORMAL HIGH (ref 70–99)
Potassium: 4.1 meq/L (ref 3.5–5.1)
Sodium: 139 meq/L (ref 135–145)
Total Bilirubin: 1 mg/dL (ref 0.2–1.2)
Total Protein: 7 g/dL (ref 6.0–8.3)

## 2023-04-03 LAB — LIPID PANEL
Cholesterol: 105 mg/dL (ref 0–200)
HDL: 33.4 mg/dL — ABNORMAL LOW (ref >39.00)
LDL Cholesterol: 42 mg/dL (ref 0–99)
NonHDL: 71.8
Total CHOL/HDL Ratio: 3
Triglycerides: 151 mg/dL — ABNORMAL HIGH (ref 0.0–149.0)
VLDL: 30.2 mg/dL (ref 0.0–40.0)

## 2023-04-03 LAB — CBC WITH DIFFERENTIAL/PLATELET
Basophils Absolute: 0.1 10*3/uL (ref 0.0–0.1)
Basophils Relative: 0.9 % (ref 0.0–3.0)
Eosinophils Absolute: 0.4 10*3/uL (ref 0.0–0.7)
Eosinophils Relative: 4.2 % (ref 0.0–5.0)
HCT: 43.9 % (ref 39.0–52.0)
Hemoglobin: 14.9 g/dL (ref 13.0–17.0)
Lymphocytes Relative: 30.8 % (ref 12.0–46.0)
Lymphs Abs: 2.6 10*3/uL (ref 0.7–4.0)
MCHC: 34 g/dL (ref 30.0–36.0)
MCV: 89.7 fl (ref 78.0–100.0)
Monocytes Absolute: 0.5 10*3/uL (ref 0.1–1.0)
Monocytes Relative: 6.3 % (ref 3.0–12.0)
Neutro Abs: 4.9 10*3/uL (ref 1.4–7.7)
Neutrophils Relative %: 57.8 % (ref 43.0–77.0)
Platelets: 292 10*3/uL (ref 150.0–400.0)
RBC: 4.9 Mil/uL (ref 4.22–5.81)
RDW: 12.7 % (ref 11.5–15.5)
WBC: 8.5 10*3/uL (ref 4.0–10.5)

## 2023-04-03 LAB — URINALYSIS, ROUTINE W REFLEX MICROSCOPIC
Bilirubin Urine: NEGATIVE
Hgb urine dipstick: NEGATIVE
Nitrite: NEGATIVE
Specific Gravity, Urine: >=1.03 — AB (ref 1.000–1.030)
Total Protein, Urine: NEGATIVE
Urine Glucose: NEGATIVE
Urobilinogen, UA: 0.2 (ref 0.0–1.0)
pH: 6 (ref 5.0–8.0)

## 2023-04-03 LAB — HEMOGLOBIN A1C: Hgb A1c MFr Bld: 6.6 % — ABNORMAL HIGH (ref 4.6–6.5)

## 2023-04-03 LAB — MICROALBUMIN / CREATININE URINE RATIO
Creatinine,U: 256.1 mg/dL
Microalb Creat Ratio: 9.5 mg/g (ref 0.0–30.0)
Microalb, Ur: 2.4 mg/dL — ABNORMAL HIGH (ref 0.0–1.9)

## 2023-04-03 LAB — PSA: PSA: 1.73 ng/mL (ref 0.10–4.00)

## 2023-04-03 NOTE — Addendum Note (Signed)
 Addended by: Shelva Majestic on: 04/03/2023 10:20 AM   Modules accepted: Level of Service
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
## 2023-04-03 NOTE — Addendum Note (Signed)
 Addended by: Samara Deist on: 04/03/2023 10:55 AM   Modules accepted: Orders

## 2023-04-03 NOTE — Patient Instructions (Addendum)
 Flu shot today  Get diabetic eye exam scheduled.  Dr. Victorino Dike with [facility] is excellent for foot and ankle issues  Please stop by lab before you go If you have mychart- we will send your results within 3 business days of [place] receiving them.  If you do not have mychart- we will call you about results within 5 business days of [place] receiving them.  *please also note that you will see labs on mychart as soon as they post. I will later go in and write notes on them- will say "notes from Dr. Durene Cal"   Recommended follow up: Return in about 6 months (around 10/04/2023) for followup or sooner if needed.Schedule b4 you leave.

## 2023-04-04 MED ORDER — EPINEPHRINE 0.3 MG/0.3ML IJ SOAJ: 0.3000 mg | INTRAMUSCULAR | 1 refills | Status: AC | PRN

## 2023-04-08 ENCOUNTER — Other Ambulatory Visit: Payer: Self-pay | Admitting: Family Medicine

## 2023-06-14 ENCOUNTER — Telehealth: Payer: Self-pay

## 2023-06-14 MED ORDER — DILTIAZEM HCL ER COATED BEADS 300 MG PO CP24: ORAL_CAPSULE | ORAL | 3 refills | Status: DC

## 2023-06-14 MED ORDER — METFORMIN HCL 500 MG PO TABS: 500.0000 mg | ORAL_TABLET | Freq: Every day | ORAL | 3 refills | Status: AC

## 2023-06-14 MED ORDER — SILDENAFIL CITRATE 100 MG PO TABS: 100.0000 mg | ORAL_TABLET | Freq: Every day | ORAL | 11 refills | Status: AC | PRN

## 2023-06-14 MED ORDER — CARVEDILOL 12.5 MG PO TABS: 12.5000 mg | ORAL_TABLET | Freq: Two times a day (BID) | ORAL | 3 refills | Status: AC

## 2023-06-14 NOTE — Telephone Encounter (Signed)
 Copied from CRM [id number]. Topic: Clinical - Medication Question >> Jun 14, 2023  1:50 PM Andrew Li wrote: Reason for CRM: Pt request prescriptions transfer to [facility] - [place], [place] - [address] [phone] moving forward

## 2023-07-11 ENCOUNTER — Encounter: Payer: Self-pay | Admitting: Cardiovascular Disease

## 2023-10-04 ENCOUNTER — Ambulatory Visit: Payer: Self-pay | Admitting: Family Medicine

## 2023-11-07 ENCOUNTER — Other Ambulatory Visit: Payer: Self-pay | Admitting: Family Medicine
